# Patient Record
Sex: Female | Born: 1975 | Race: White | Hispanic: No | Marital: Married | Smoking: Never smoker
Health system: Southern US, Community
[De-identification: ages and names within clinical notes are randomized; demographics above are authoritative.]

## PROBLEM LIST (undated history)

## (undated) DIAGNOSIS — F419 Anxiety disorder, unspecified: Secondary | ICD-10-CM

## (undated) HISTORY — PX: BARIATRIC SURGERY: SHX1103

## (undated) HISTORY — DX: Anxiety disorder, unspecified: F41.9

---

## 2000-02-08 ENCOUNTER — Other Ambulatory Visit: Admission: RE | Admit: 2000-02-08 | Discharge: 2000-02-08 | Payer: Self-pay | Admitting: Obstetrics and Gynecology

## 2000-08-21 ENCOUNTER — Inpatient Hospital Stay (HOSPITAL_COMMUNITY): Admission: AD | Admit: 2000-08-21 | Discharge: 2000-08-21 | Payer: Self-pay | Admitting: Obstetrics and Gynecology

## 2000-09-03 ENCOUNTER — Inpatient Hospital Stay (HOSPITAL_COMMUNITY): Admission: AD | Admit: 2000-09-03 | Discharge: 2000-09-03 | Payer: Self-pay | Admitting: Obstetrics and Gynecology

## 2000-09-04 ENCOUNTER — Inpatient Hospital Stay (HOSPITAL_COMMUNITY): Admission: AD | Admit: 2000-09-04 | Discharge: 2000-09-06 | Payer: Self-pay | Admitting: Obstetrics and Gynecology

## 2000-12-10 ENCOUNTER — Emergency Department (HOSPITAL_COMMUNITY): Admission: EM | Admit: 2000-12-10 | Discharge: 2000-12-10 | Payer: Self-pay | Admitting: Emergency Medicine

## 2021-04-04 ENCOUNTER — Ambulatory Visit: Payer: Self-pay | Admitting: Family Medicine

## 2021-04-04 ENCOUNTER — Other Ambulatory Visit: Payer: Self-pay

## 2021-04-04 ENCOUNTER — Encounter: Payer: Self-pay | Admitting: Family Medicine

## 2021-04-04 VITALS — BP 117/84 | HR 72 | Ht 64.0 in | Wt 234.8 lb

## 2021-04-04 DIAGNOSIS — Z7689 Persons encountering health services in other specified circumstances: Secondary | ICD-10-CM

## 2021-04-04 DIAGNOSIS — F331 Major depressive disorder, recurrent, moderate: Secondary | ICD-10-CM

## 2021-04-04 DIAGNOSIS — F411 Generalized anxiety disorder: Secondary | ICD-10-CM

## 2021-04-04 DIAGNOSIS — F3341 Major depressive disorder, recurrent, in partial remission: Secondary | ICD-10-CM | POA: Insufficient documentation

## 2021-04-04 DIAGNOSIS — N6315 Unspecified lump in the right breast, overlapping quadrants: Secondary | ICD-10-CM

## 2021-04-04 DIAGNOSIS — Z803 Family history of malignant neoplasm of breast: Secondary | ICD-10-CM

## 2021-04-04 MED ORDER — ESCITALOPRAM OXALATE 10 MG PO TABS: 10.0000 mg | ORAL_TABLET | Freq: Every day | ORAL | 2 refills | Status: DC

## 2021-04-04 NOTE — Patient Instructions (Addendum)
Thank you for coming to the office today.  As discussed, it sounds like your symptoms are primarily related to anxiety / adjustment disorder. This is a very common problem and be related to several factors, including life stressors.  Start treatment with Lexapro, take 10mg  one daily for now. As discussed most anxiety medications are also used for mood disorders such as depression, because they work on similar chemicals in your brain. It may take up to 3-4 weeks for the medicine to take full effect and for you to notice a difference, sometimes you may notice it working sooner, otherwise we may need to adjust the dose.  For most patients with anxiety or mood concerns, we generally recommend referral to establish with a therapist or counselor as well. This has been shown to improve the effectiveness of the medications, and in the future we may be able to taper off medications.   Call me back with location you prefer for therapy / psychiatry and we can refer.  -------------------------------------  For Mammogram screening for breast cancer   Call the [place] below anytime to schedule your own appointment now that order has been placed.  [facility] [place], [place] [address] Phone: [phone]   ----------------------------------------------------  PSYCHIATRY ([place])   [place] Address: [address], [place], [place] [address] [email or web address] Phone: [phone]  [place] ([place] [facility]) Address: [address], [place], [address] [address] Hours: 8:30AM-5PM Phone: [phone]  [place] [facility] [place], [place] [address] Phone: [phone]  Cephus Shelling, MD [place], [place]  [address] Phone: [phone]  [facility] (All ages) [address], [place], [id number] Phone: [phone] (Option 1) [email or web address]   [facility]) [place] [address] [place], [place] [address] Phone: [phone]   ----------------------------------------------------------------- [place] [address] [place], [place] [address] Johnnette Litter P: [place] - online and also in [facility], [address] [place], [place] [address]: [place].   Address: [place], [place], [place] [address] Hours: Open today  9AM-7PM Phone: [phone]  [facility] [address], [place] Address: [address] [place], [place],  [address] Phone: [phone]     Please schedule a Follow-up Appointment to: Return in about 4 weeks (around 05/02/2021) for 4 weeks follow-up Depression/Anxiety med adjust.  If you have any other questions or concerns, please feel free to call the office or send a message through [place]. You may also schedule an earlier appointment if necessary.  Additionally, you may be receiving a survey about your experience at our office within a few days to 1 week by e-mail or mail. We value your feedback.  [name], DO [place]

## 2021-04-04 NOTE — Progress Notes (Signed)
Subjective:    Patient ID: Elizabeth Schultz, female    DOB: 1976-02-13, 45 y.o.   MRN: [id number]  Elizabeth Schultz is a 45 y.o. female presenting on 04/04/2021 for Establish Care Chief complaint - depression  Previous PCP Dr Luan Pulling [facility]) years ago, now returning to clinic >5  HPI  Mood Disorder / Anxiety mixed Describes more of a chronic problem, has not been addressed before. Never on treatment or therapy before. She has experienced major life stressor change, married for 22 years and husband left 1 month ago, and he left because she has not addressed some mental health issues prior. -  - She is worried about her family history mental health. Father dx Bipolar Disorder in age 8s, and also Grandmother on paternal side has similar issues in past. Her father is on medication. Admits Fatigue that has been more severe onset  Intentional Weight Loss She is doing the [place] program since Feb 2022, has lost 48 lbs  Right Breast Lump Admits a localized spot on R 3 o clock breast lump, has felt a spot on breast that is a sore tender spot or lump, unsure if she has had this before, now only noticed with weight loss. - Maternal side of family - Grandmother age 46 and and 65 age 32 with breast cancer - Paternal side of family - Aunt had breast cancer age 59   Depression screen PHQ 2/9 04/04/2021  Decreased Interest 2  Down, Depressed, Hopeless 3  PHQ - 2 Score 5  Altered sleeping 3  Tired, decreased energy 3  Change in appetite 0  Feeling bad or failure about yourself  3  Trouble concentrating 2  Moving slowly or fidgety/restless 0  Suicidal thoughts 1  PHQ-9 Score 17  Difficult doing work/chores Extremely dIfficult   GAD 7 : Generalized Anxiety Score 04/04/2021  Nervous, Anxious, on Edge 3  Control/stop worrying 3  Worry too much - different things 3  Trouble relaxing 3  Restless 2  Easily annoyed or irritable 3  Afraid - awful might happen 3  Total GAD 7 Score 20  Anxiety  Difficulty Very difficult      Past Medical History:  Diagnosis Date   Anxiety    Past Surgical History:  Procedure Laterality Date   ANTERIOR CRUCIATE LIGAMENT REPAIR  05/2011   TUBAL LIGATION Bilateral 03/2003   WISDOM TOOTH EXTRACTION Bilateral 11/1995   Social History   Socioeconomic History   Marital status: Married    Spouse name: Not on file   Number of children: Not on file   Years of education: Not on file   Highest education level: Not on file  Occupational History   Not on file  Tobacco Use   Smoking status: Never   Smokeless tobacco: Never  Substance and Sexual Activity   Alcohol use: Not Currently   Drug use: Never   Sexual activity: Yes  Other Topics Concern   Not on file  Social History Narrative   Not on file   Social Determinants of Health   Financial Resource Strain: Not on file  Food Insecurity: Not on file  Transportation Needs: Not on file  Physical Activity: Not on file  Stress: Not on file  Social Connections: Not on file  Intimate Partner Violence: Not on file   Family History  Problem Relation Age of Onset   Heart disease Mother    Thyroid disease Mother    Bipolar disorder Father    Breast cancer  Maternal Grandmother 45   Bipolar disorder Paternal Grandmother    Breast cancer Maternal Aunt 52   Breast cancer Paternal Aunt 29   Current Outpatient Medications on File Prior to Visit  Medication Sig   omeprazole (PRILOSEC) 20 MG capsule Take 20 mg by mouth daily.   ibuprofen (ADVIL) 600 MG tablet Take by mouth.   No current facility-administered medications on file prior to visit.    Review of Systems Per HPI unless specifically indicated above      Objective:    BP 117/84   Pulse 72   Ht 5\' 4"  (1.626 m)   Wt 234 lb 12.8 oz (106.5 kg)   SpO2 99%   BMI 40.30 kg/m   Wt Readings from Last 3 Encounters:  04/04/21 234 lb 12.8 oz (106.5 kg)    Physical Exam Vitals and nursing note reviewed.  Constitutional:       General: She is not in acute distress.    Appearance: Normal appearance. She is well-developed. She is not diaphoretic.     Comments: Well-appearing, comfortable, cooperative  HENT:     Head: Normocephalic and atraumatic.  Eyes:     General:        Right eye: No discharge.        Left eye: No discharge.     Conjunctiva/sclera: Conjunctivae normal.  Cardiovascular:     Rate and Rhythm: Normal rate.  Pulmonary:     Effort: Pulmonary effort is normal.  Chest:    Skin:    General: Skin is warm and dry.     Findings: No erythema or rash.  Neurological:     Mental Status: She is alert and oriented to person, place, and time.  Psychiatric:        Behavior: Behavior normal.        Thought Content: Thought content normal.     Comments: Well groomed, good eye contact, normal speech and thoughts. Depressed mood and anxious at times, tearful   No results found for this or any previous visit.    Assessment & Plan:   Problem List Items Addressed This Visit     Major depressive disorder, recurrent, moderate (HCC)   Relevant Medications   escitalopram (LEXAPRO) 10 MG tablet   GAD (generalized anxiety disorder)   Relevant Medications   escitalopram (LEXAPRO) 10 MG tablet   Other Visit Diagnoses     Breast lump on right side at 3 o'clock position    -  Primary   Relevant Orders   MM DIAG BREAST TOMO BILATERAL   US BREAST LTD UNI RIGHT INC AXILLA   Encounter to establish care with new doctor       Family history of breast cancer           Major Depression recurrent moderate Generalized Anxiety Disorder  Clinically with relatively new worsening flare of chronic underlying mental health problem that has not been formally diagnosed and treated before  Significant life stressors and factors at play with husband leaving her related to the mental health issue and she has some strong fam history of mental health problems that is worrying her more as well.  Currently based on scoring  of symptoms and history it seems she has primarily unipolar major depression, no evidence of bipolar based on history and discussion today, can pursue further screening tools at next visit. Anxiety is secondary it seems to mood. Has secondary physical symptoms from depression including significant fatigue now worse following onset of depression  Discussion today on treatment options. She has current limited support system.   Plan: 1. Discussion on new diagnosis management, complications 2. Start Escitalopram 10mg  daily AM with food, counseling on potential side effects risks, reviewed possible GI intolerance - anticipate 4-6 weeks for notable effect, may need titrate dose to 20 in future 3. Referral list given handout for therapy / psychiatry - strongly recommended finding therapist at least, she can contact us when ready for referral 4. Follow-up 4 weeks to adjust med and evaluate ---------  R Breast Lump/Mass No prior mammogram screening Strong fam history of breast cancer  Order Diagnostic Mammo + [place] at [place] This Encounter  Procedures   MM DIAG BREAST TOMO BILATERAL    Standing Status:   Future    Standing Expiration Date:   10/04/2021    Order Specific Question:   Reason for Exam (SYMPTOM  OR DIAGNOSIS REQUIRED)    Answer:   Right breast 3 o clock range central lump/mass, mild tender, strong fam history of breast cancer. no prior mammogram.    Order Specific Question:   Preferred imaging location?    Answer:   [facility]   US BREAST LTD UNI RIGHT INC AXILLA    Standing Status:   Future    Standing Expiration Date:   10/04/2021    Order Specific Question:   Reason for Exam (SYMPTOM  OR DIAGNOSIS REQUIRED)    Answer:   Right breast 3 o clock central area lump/mass tender, strong fam history of breast cancer, no prior mammogram    Order Specific Question:   Preferred imaging location?    Answer:   [facility] ordered this encounter   Medications   escitalopram (LEXAPRO) 10 MG tablet    Sig: Take 1 tablet (10 mg total) by mouth daily.    Dispense:  30 tablet    Refill:  2     Follow up plan: Return in about 4 weeks (around 05/02/2021) for 4 weeks follow-up Depression/Anxiety med adjust.   [name], DO [facility] 04/04/2021, 10:18 AM

## 2021-04-06 ENCOUNTER — Other Ambulatory Visit: Payer: Self-pay

## 2021-04-06 ENCOUNTER — Ambulatory Visit
Admission: RE | Admit: 2021-04-06 | Discharge: 2021-04-06 | Disposition: A | Discharge status: Home / Self Care | Payer: BC Managed Care – PPO | Source: Ambulatory Visit | Attending: Family Medicine | Admitting: Family Medicine

## 2021-04-06 DIAGNOSIS — N6315 Unspecified lump in the right breast, overlapping quadrants: Secondary | ICD-10-CM

## 2021-05-02 ENCOUNTER — Other Ambulatory Visit: Payer: Self-pay

## 2021-05-02 ENCOUNTER — Encounter: Payer: Self-pay | Admitting: Family Medicine

## 2021-05-02 ENCOUNTER — Ambulatory Visit (INDEPENDENT_AMBULATORY_CARE_PROVIDER_SITE_OTHER): Payer: BC Managed Care – PPO | Admitting: Family Medicine

## 2021-05-02 VITALS — BP 112/62 | HR 65 | Ht 64.0 in | Wt 230.4 lb

## 2021-05-02 DIAGNOSIS — F411 Generalized anxiety disorder: Secondary | ICD-10-CM

## 2021-05-02 DIAGNOSIS — F331 Major depressive disorder, recurrent, moderate: Secondary | ICD-10-CM

## 2021-05-02 MED ORDER — BUSPIRONE HCL 5 MG PO TABS: 5.0000 mg | ORAL_TABLET | Freq: Three times a day (TID) | ORAL | 2 refills | Status: DC

## 2021-05-02 NOTE — Progress Notes (Signed)
Subjective:    Patient ID: Elizabeth Schultz, female    DOB: 1976/06/16, 45 y.o.   MRN: [id number]  Elizabeth Schultz is a 45 y.o. female presenting on 05/02/2021 for Depression and Anxiety   HPI  Follow-up R Breast Lump / Mammogram She had work up last time 03/2021 with diagnostic mammo / [place], ultimately determined that she has dense breast tissue and negative test results, repeat in 1 year. She may be eligible for MRI in future. With dense tissue.  Generalized Anxiety Disorder (GAD) Major Depression recurrent moderate  Last visit 04/04/21, she was initiated on Escitalopram 10mg  daily, see prior note. No prior treatment. She has noticed improvement overall in her mood symptoms but only partially improved. Still has significant stressors and anxiety impacting her daily function. She describes stressors as before with work and home situation. As mentioned last last visit.  She has therapist / counselor through work, talks to them 2-3 times a week, does help  She feels like the anxiety did not seem to be controlled as well on med.  Father dx Bipolar Disorder in age 2s, and also Grandmother on paternal side has similar issues in past. Her father is on medication.    Depression screen [facility] 2/9 05/02/2021 04/04/2021  Decreased Interest 2 2  Down, Depressed, Hopeless 2 3  PHQ - 2 Score 4 5  Altered sleeping 2 3  Tired, decreased energy 2 3  Change in appetite 0 0  Feeling bad or failure about yourself  2 3  Trouble concentrating 2 2  Moving slowly or fidgety/restless 0 0  Suicidal thoughts 1 1  PHQ-9 Score 13 17  Difficult doing work/chores Very difficult Extremely dIfficult   GAD 7 : Generalized Anxiety Score 05/02/2021 04/04/2021  Nervous, Anxious, on Edge 2 3  Control/stop worrying 3 3  Worry too much - different things 3 3  Trouble relaxing 3 3  Restless 0 2  Easily annoyed or irritable 2 3  Afraid - awful might happen 3 3  Total GAD 7 Score 16 20  Anxiety Difficulty Very  difficult Very difficult     Social History   Tobacco Use   Smoking status: Never   Smokeless tobacco: Never  Substance Use Topics   Alcohol use: Not Currently   Drug use: Never    Review of Systems Per HPI unless specifically indicated above     Objective:    BP 112/62   Pulse 65   Ht 5\' 4"  (1.626 m)   Wt 230 lb 6.4 oz (104.5 kg)   SpO2 100%   BMI 39.55 kg/m   Wt Readings from Last 3 Encounters:  05/02/21 230 lb 6.4 oz (104.5 kg)  04/04/21 234 lb 12.8 oz (106.5 kg)    Physical Exam Vitals and nursing note reviewed.  Constitutional:      General: She is not in acute distress.    Appearance: Normal appearance. She is well-developed. She is not diaphoretic.     Comments: Well-appearing, comfortable, cooperative  HENT:     Head: Normocephalic and atraumatic.  Eyes:     General:        Right eye: No discharge.        Left eye: No discharge.     Conjunctiva/sclera: Conjunctivae normal.  Cardiovascular:     Rate and Rhythm: Normal rate.  Pulmonary:     Effort: Pulmonary effort is normal.  Skin:    General: Skin is warm and dry.  Findings: No erythema or rash.  Neurological:     Mental Status: She is alert and oriented to person, place, and time.  Psychiatric:        Mood and Affect: Mood normal.        Behavior: Behavior normal.        Thought Content: Thought content normal.     Comments: Well groomed, good eye contact, normal speech and thoughts   No results found for this or any previous visit.    Assessment & Plan:   Problem List Items Addressed This Visit     Major depressive disorder, recurrent, moderate (HCC) - Primary   Relevant Medications   busPIRone (BUSPAR) 5 MG tablet   GAD (generalized anxiety disorder)   Relevant Medications   busPIRone (BUSPAR) 5 MG tablet    Mood improved on SSRI Escitalopram 10mg  daily Anxiety limited benefit  Counseling with still has notable daily anxiety, generalized impacting function  Continue with CBT  therapist through work 2-3 days a week  Continue SSRI Escitalopram 10mg  daily, offer dose increase instead will add 2nd agent  Will add Buspar 5mg  2-3 times a day for anxiety maintenance. Can start twice a day first, then inc to 3 times a day if wears off during day. Adjust dose accordingly in future.  Return 4-6 weeks if not improved Return 3 months if doing better   Meds ordered this encounter  Medications   busPIRone (BUSPAR) 5 MG tablet    Sig: Take 1 tablet (5 mg total) by mouth 3 (three) times daily.    Dispense:  90 tablet    Refill:  2       Follow up plan: Return in about 3 months (around 08/02/2021) for 3 month for anxiety/mood follow-up, med adjust, or sooner 4-6 week if not improved.  Elizabeth Schultz, [facility] 05/02/2021, 12:15 PM

## 2021-05-02 NOTE — Patient Instructions (Addendum)
Start Buspar 5mg  2-3 times a day  Please schedule a Follow-up Appointment to: Return in about 3 months (around 08/02/2021) for 3 month for anxiety/mood follow-up, med adjust, or sooner 4-6 week if not improved.  If you have any other questions or concerns, please feel free to call the office or send a message through [place]. You may also schedule an earlier appointment if necessary.  Additionally, you may be receiving a survey about your experience at our office within a few days to 1 week by e-mail or mail. We value your feedback.  [name], DO [place]

## 2021-07-02 ENCOUNTER — Other Ambulatory Visit: Payer: Self-pay | Admitting: Family Medicine

## 2021-07-02 DIAGNOSIS — F411 Generalized anxiety disorder: Secondary | ICD-10-CM

## 2021-07-02 DIAGNOSIS — F331 Major depressive disorder, recurrent, moderate: Secondary | ICD-10-CM

## 2021-07-03 NOTE — Telephone Encounter (Signed)
Requested Prescriptions  Pending Prescriptions Disp Refills  . escitalopram (LEXAPRO) 10 MG tablet [Pharmacy Med Name: ESCITALOPRAM 10 MG TABLET] 30 tablet 2    Sig: TAKE 1 TABLET BY MOUTH EVERY DAY     Psychiatry:  Antidepressants - SSRI Passed - 07/02/2021  4:29 PM      Passed - Completed PHQ-2 or PHQ-9 in the last 360 days      Passed - Valid encounter within last 6 months    Recent Outpatient Visits          2 months ago Major depressive disorder, recurrent, moderate ([place])   [place], DO   3 months ago Breast lump on right side at 3 o'clock position   [place], DO      Future Appointments            In 1 month [place], Devonne Doughty, [facility], [facility]

## 2021-08-08 ENCOUNTER — Encounter: Payer: Self-pay | Admitting: Family Medicine

## 2021-08-08 ENCOUNTER — Ambulatory Visit: Payer: BC Managed Care – PPO | Admitting: Family Medicine

## 2021-08-08 ENCOUNTER — Other Ambulatory Visit: Payer: Self-pay

## 2021-08-08 VITALS — BP 128/76 | HR 67 | Ht 64.0 in | Wt 231.8 lb

## 2021-08-08 DIAGNOSIS — F3341 Major depressive disorder, recurrent, in partial remission: Secondary | ICD-10-CM

## 2021-08-08 DIAGNOSIS — F411 Generalized anxiety disorder: Secondary | ICD-10-CM

## 2021-08-08 MED ORDER — SAXENDA 18 MG/3ML ~~LOC~~ SOPN: PEN_INJECTOR | SUBCUTANEOUS | 2 refills | Status: DC

## 2021-08-08 MED ORDER — INSULIN PEN NEEDLE 31G X 8 MM MISC: 3 refills | Status: DC

## 2021-08-08 MED ORDER — BUSPIRONE HCL 5 MG PO TABS: 5.0000 mg | ORAL_TABLET | Freq: Two times a day (BID) | ORAL | 3 refills | Status: DC

## 2021-08-08 MED ORDER — ESCITALOPRAM OXALATE 10 MG PO TABS: 10.0000 mg | ORAL_TABLET | Freq: Every day | ORAL | 3 refills | Status: DC

## 2021-08-08 NOTE — Progress Notes (Signed)
Subjective:    Patient ID: Elizabeth Schultz, female    DOB: [birth year], 45 y.o.   MRN: [id number]  Elizabeth Schultz is a 45 y.o. female presenting on 08/08/2021 for Depression   HPI   Generalized Anxiety Disorder (GAD) Major Depression recurrent in partial remission  Last visit 04/2021, improved on Escitalopram 10mg , and added Buspar for anxiety  Today she does very well with improved mood and anxiety. Her current dosage is Escitalopram 10mg  daily and Buspar 5mg  twice a day    She has therapist / counselor through work, talks to them 2-3 times a week, does help    Morbid Obesity BMI >39 Weight 308 lbs She has lost 78 lbs She describes difficulty with losing excess tissue/skin Asks about plastic surgery for removal of excess tissue/skin Considering weight management medication now as well    Depression screen [facility] 2/9 08/08/2021 05/02/2021 04/04/2021  Decreased Interest 0 2 2  Down, Depressed, Hopeless 0 2 3  PHQ - 2 Score 0 4 5  Altered sleeping 0 2 3  Tired, decreased energy 0 2 3  Change in appetite 0 0 0  Feeling bad or failure about yourself  1 2 3   Trouble concentrating 0 2 2  Moving slowly or fidgety/restless 0 0 0  Suicidal thoughts 0 1 1  PHQ-9 Score 1 13 17   Difficult doing work/chores Somewhat difficult Very difficult Extremely dIfficult   GAD 7 : Generalized Anxiety Score 08/08/2021 05/02/2021 04/04/2021  Nervous, Anxious, on Edge 0 2 3  Control/stop worrying 0 3 3  Worry too much - different things 0 3 3  Trouble relaxing 0 3 3  Restless 1 0 2  Easily annoyed or irritable 0 2 3  Afraid - awful might happen 0 3 3  Total GAD 7 Score 1 16 20   Anxiety Difficulty Somewhat difficult Very difficult Very difficult      Social History   Tobacco Use   Smoking status: Never   Smokeless tobacco: Never  Substance Use Topics   Alcohol use: Not Currently   Drug use: Never    Family History  Problem Relation Age of Onset   Heart disease Mother    Thyroid  disease Mother    Bipolar disorder Father    Breast cancer Maternal Grandmother 57   Bipolar disorder Paternal Grandmother    Breast cancer Maternal Aunt 76   Breast cancer Paternal Aunt 47     Review of Systems Per HPI unless specifically indicated above     Objective:    BP 128/76   Pulse 67   Ht 5\' 4"  (1.626 m)   Wt 231 lb 12.8 oz (105.1 kg)   SpO2 100%   BMI 39.79 kg/m   Wt Readings from Last 3 Encounters:  08/08/21 231 lb 12.8 oz (105.1 kg)  05/02/21 230 lb 6.4 oz (104.5 kg)  04/04/21 234 lb 12.8 oz (106.5 kg)    Physical Exam Vitals and nursing note reviewed.  Constitutional:      General: She is not in acute distress.    Appearance: Normal appearance. She is well-developed. She is obese. She is not diaphoretic.     Comments: Well-appearing, comfortable, cooperative  HENT:     Head: Normocephalic and atraumatic.  Eyes:     General:        Right eye: No discharge.        Left eye: No discharge.     Conjunctiva/sclera: Conjunctivae normal.  Cardiovascular:  Rate and Rhythm: Normal rate.  Pulmonary:     Effort: Pulmonary effort is normal.  Skin:    General: Skin is warm and dry.     Findings: No erythema or rash.  Neurological:     Mental Status: She is alert and oriented to person, place, and time.  Psychiatric:        Mood and Affect: Mood normal.        Behavior: Behavior normal.        Thought Content: Thought content normal.     Comments: Well groomed, good eye contact, normal speech and thoughts    No results found for this or any previous visit.    Assessment & Plan:   Problem List Items Addressed This Visit     Morbid obesity ([place])   Relevant Medications   SAXENDA 18 MG/3ML SOPN   Insulin Pen Needle 31G X 8 MM MISC   Other Relevant Orders   Hemoglobin W6F   BASIC METABOLIC PANEL WITH GFR   TSH   Major depressive disorder, recurrent, in partial remission (HCC) - Primary   Relevant Medications   escitalopram (LEXAPRO) 10 MG tablet    busPIRone (BUSPAR) 5 MG tablet   GAD (generalized anxiety disorder)   Relevant Medications   escitalopram (LEXAPRO) 10 MG tablet   busPIRone (BUSPAR) 5 MG tablet    Mood improved, depression in partial remission now on SSRI Escitalopram 10mg  daily  Anxiety controlled on buspar 5mg  BID, rare PRN dose not using yet   Continue with CBT therapist through work   Morbid obesity BMI >39 Co morbid mood depression Successful lifestyle modification wt loss down 75 lbs, has some extra tissue/skin asking about plastic surgery in future, but would like to continue losing wt Check A1c, TSH BMET today Start Saxenda sample today, titrate dose, reviewed benefit risk side effects Ordered for PA Will follow-up within 3 months on progress wt loss   Meds ordered this encounter  Medications   escitalopram (LEXAPRO) 10 MG tablet    Sig: Take 1 tablet (10 mg total) by mouth daily.    Dispense:  90 tablet    Refill:  3   busPIRone (BUSPAR) 5 MG tablet    Sig: Take 1 tablet (5 mg total) by mouth 2 (two) times daily.    Dispense:  180 tablet    Refill:  3   SAXENDA 18 MG/3ML SOPN    Sig: Injection 0.6 mg into skin once daily for 1 week, as tolerated increase by increment of 0.6mg  every 1 week, max dose is 3mg  injection daily after 5 weeks.    Dispense:  15 mL    Refill:  2    Prior authorization to be completed   Insulin Pen Needle 31G X 8 MM MISC    Sig: Use with saxenda injection once daily    Dispense:  90 each    Refill:  3     Follow up plan: Return in about 3 months (around 11/08/2021) for 3 month follow-up Weight Management, Mood/Anxiety GAD PHQ.  [name], [facility] 08/08/2021, 8:59 AM

## 2021-08-08 NOTE — Patient Instructions (Addendum)
Thank you for coming to the office today.  Please check into Plastic Surgery options towards [facility] for future.  Saxenda (Liraglutide) - once DAILY - 3 dose changes 0.6, 1.2 and 1.8, side effects nausea, upset stomach higher on this one but it is still very effective medicine  We will work on prior authorization approval.  Use the copay coupon card included when ready to pick up.  Sample is for 3 weeks. Dose increase each week as tolerated, can stop inc or reduce if need   Please schedule a Follow-up Appointment to: Return in about 3 months (around 11/08/2021) for 3 month follow-up Weight Management, Mood/Anxiety GAD PHQ.  If you have any other questions or concerns, please feel free to call the office or send a message through [place]. You may also schedule an earlier appointment if necessary.  Additionally, you may be receiving a survey about your experience at our office within a few days to 1 week by e-mail or mail. We value your feedback.  [name], DO [place]

## 2021-08-09 LAB — BASIC METABOLIC PANEL WITH GFR
BUN: 12 mg/dL (ref 7–25)
CO2: 26 mmol/L (ref 20–32)
Calcium: 9 mg/dL (ref 8.6–10.2)
Chloride: 104 mmol/L (ref 98–110)
Creat: 0.74 mg/dL (ref 0.50–0.99)
Glucose, Bld: 94 mg/dL (ref 65–99)
Potassium: 4.4 mmol/L (ref 3.5–5.3)
Sodium: 139 mmol/L (ref 135–146)
eGFR: 103 mL/min/{1.73_m2} (ref >=60)

## 2021-08-09 LAB — HEMOGLOBIN A1C
Hgb A1c MFr Bld: 5 % of total Hgb (ref <5.7)
Mean Plasma Glucose: 97 mg/dL
eAG (mmol/L): 5.4 mmol/L

## 2021-08-09 LAB — TSH: TSH: 2.24 mIU/L

## 2021-08-13 ENCOUNTER — Other Ambulatory Visit: Payer: Self-pay | Admitting: Family Medicine

## 2021-08-13 DIAGNOSIS — F411 Generalized anxiety disorder: Secondary | ICD-10-CM

## 2021-08-13 NOTE — Telephone Encounter (Signed)
Called [facility] regarding prescription. [place] (Pharmacist) stated to disregard prescription request.

## 2021-10-11 ENCOUNTER — Ambulatory Visit: Payer: BC Managed Care – PPO | Admitting: Family Medicine

## 2021-11-11 ENCOUNTER — Ambulatory Visit: Payer: BC Managed Care – PPO | Admitting: Family Medicine

## 2021-11-11 ENCOUNTER — Encounter: Payer: Self-pay | Admitting: Family Medicine

## 2021-11-11 ENCOUNTER — Other Ambulatory Visit: Payer: Self-pay

## 2021-11-11 DIAGNOSIS — G5711 Meralgia paresthetica, right lower limb: Secondary | ICD-10-CM

## 2021-11-11 DIAGNOSIS — F3341 Major depressive disorder, recurrent, in partial remission: Secondary | ICD-10-CM | POA: Diagnosis not present

## 2021-11-11 DIAGNOSIS — F411 Generalized anxiety disorder: Secondary | ICD-10-CM | POA: Diagnosis not present

## 2021-11-11 MED ORDER — SAXENDA 18 MG/3ML ~~LOC~~ SOPN: 3.0000 mg | PEN_INJECTOR | Freq: Every day | SUBCUTANEOUS | 5 refills | Status: DC

## 2021-11-11 NOTE — Progress Notes (Signed)
Subjective:    Patient ID: Elizabeth Schultz, female    DOB: [birth year], 46 y.o.   MRN: [id number]  Elizabeth Schultz is a 46 y.o. 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 Her current dosage is Escitalopram 191mdaily and Buspar 56m58mwice a day She has therapist / counselor through work, talks to them 2-3 times a week, does help   Morbid Obesity BMI >38 Weight down to 224, down 10 lbs in past 6 months She describes difficulty with losing excess tissue/skin Improved overall with reduced PO intake on med, doing well wants to continue Saxenda 3mg81msing daily, has savings card. Asks about plastic surgery for removal of excess tissue/skin Considering weight management medication now as well  GI updates Food Impaction / Eosinophilic Esophagitis /GERD 10/13/21 ED visit at [facility] Had food impaction choking, she is on liquid diet and not able to eat meat right now. She has apt today follow up with [facility] and has scheduled EGD 12/30/21. - She was placed on high dose Omeprazole with major improvement.  Right Low Back Pain Meralgia Paresthetica Reports R lateral thigh region pins needles tingling. Worse with fixed position or laying on it at night.   Depression screen PHQ [facility] 11/11/2021 08/08/2021 05/02/2021  Decreased Interest 0 0 2  Down, Depressed, Hopeless 0 0 2  PHQ - 2 Score 0 0 4  Altered sleeping 0 0 2  Tired, decreased energy 0 0 2  Change in appetite 0 0 0  Feeling bad or failure about yourself  1 1 2   Trouble concentrating 0 0 2  Moving slowly or fidgety/restless 0 0 0  Suicidal thoughts 0 0 1  PHQ-9 Score 1 1 13   Difficult doing work/chores Not difficult at all Somewhat difficult Very difficult   GAD 7 : Generalized Anxiety Score  11/11/2021 08/08/2021 05/02/2021 04/04/2021  Nervous, Anxious, on Edge 0 0 2 3  Control/stop worrying 0 0 3 3  Worry too much - different things 0 0 3 3  Trouble relaxing 0 0 3 3  Restless 1 1 0 2  Easily annoyed or irritable 0 0 2 3  Afraid - awful might happen 0 0 3 3  Total GAD 7 Score 1 1 16 20   Anxiety Difficulty Somewhat difficult Somewhat difficult Very difficult Very difficult      Social History   Tobacco Use   Smoking status: Never   Smokeless tobacco: Never  Substance Use Topics   Alcohol use: Not Currently   Drug use: Never    Review of Systems Per HPI unless specifically indicated above     Objective:    BP (!) 137/91    Pulse 73    Ht 5' 4"  (1.626 m)    Wt 224 lb 6.4 oz (101.8 kg)    SpO2 100%    BMI 38.52 kg/m   Wt Readings from Last 3 Encounters:  11/11/21 224 lb 6.4 oz (101.8 kg)  08/08/21 231 lb 12.8 oz (105.1 kg)  05/02/21 230 lb 6.4 oz (104.5 kg)    Physical Exam Vitals and nursing note reviewed.  Constitutional:      General: She is not in acute distress.    Appearance: Normal appearance. She is well-developed. She is not diaphoretic.  Comments: Well-appearing, comfortable, cooperative  HENT:     Head: Normocephalic and atraumatic.  Eyes:     General:        Right eye: No discharge.        Left eye: No discharge.     Conjunctiva/sclera: Conjunctivae normal.  Cardiovascular:     Rate and Rhythm: Normal rate.  Pulmonary:     Effort: Pulmonary effort is normal.  Skin:    General: Skin is warm and dry.     Findings: No erythema or rash.  Neurological:     Mental Status: She is alert and oriented to person, place, and time.     Sensory: Sensory deficit (R lateral thigh) present.  Psychiatric:        Mood and Affect: Mood normal.        Behavior: Behavior normal.        Thought Content: Thought content normal.     Comments: Well groomed, good eye contact, normal speech and thoughts     Results for orders placed or performed in visit  on 08/08/21  Hemoglobin A1c  Result Value Ref Range   Hgb A1c MFr Bld 5.0 <5.7 % of total Hgb   Mean Plasma Glucose 97 mg/dL   eAG (mmol/L) 5.4 mmol/L  BASIC METABOLIC PANEL WITH GFR  Result Value Ref Range   Glucose, Bld 94 65 - 99 mg/dL   BUN 12 7 - 25 mg/dL   Creat 0.74 0.50 - 0.99 mg/dL   eGFR 103 > OR = 60 mL/min/1.65m   BUN/Creatinine Ratio NOT APPLICABLE 6 - 22 (calc)   Sodium 139 135 - 146 mmol/L   Potassium 4.4 3.5 - 5.3 mmol/L   Chloride 104 98 - 110 mmol/L   CO2 26 20 - 32 mmol/L   Calcium 9.0 8.6 - 10.2 mg/dL  TSH  Result Value Ref Range   TSH 2.24 mIU/L      Assessment & Plan:   Problem List Items Addressed This Visit     Morbid obesity ([place] - Primary   Relevant Medications   SAXENDA 18 MG/3ML SOPN   Major depressive disorder, recurrent, in partial remission (HCC)   GAD (generalized anxiety disorder)   Other Visit Diagnoses     Meralgia paresthetica of right side       Relevant Medications   SAXENDA 18 MG/3ML SOPN       Mood improved, depression in partial remission now Anxiety controlled on buspar 562mBID regular dosing. on SSRI Escitalopram 1048maily  Continue with CBT therapist through work   Morbid obesity BMI >38 Co morbid mood depression Successful lifestyle modification wt loss down 75 lbs since onset. Down 10 lbs in 6 months Re order Saxenda 3mg47mily inj 3-6 more months TBD  Meralgia Paresthetica R thigh Assoc R LBP and symptoms localized to R lateral thigh very positional and certain triggers Reviewed treatment OTC NSAID course, back treatments and future consider Gabapentin for neuropathic symptoms, main goal is to avoid behavior that is pinching nerve and modify activity See AVS  Meds ordered this encounter  Medications   SAXENDA 18 MG/3ML SOPN    Sig: Inject 3 mg into the skin daily.    Dispense:  15 mL    Refill:  5      Follow up plan: Return in about 6 months (around 05/11/2022) for 6 month Annual Physical AM fasting  lab AFTER.  Future Thyroid testing add to panel.  AlexNobie Putnam [facility] 11/11/2021, 8:49 AM

## 2021-11-11 NOTE — Patient Instructions (Addendum)
Thank you for coming to the office today.  Refilled Saxenda for 3mg  dosing, keep on track. Great job so far.  Online search for [employer] Card if you cannot reactive the previous one  First for the Meralgia Paresthetica - try iburpofen or aleve, prefer aleve OTC 220 or 250mg  x 2 = 500mg  approx twice a day with meal for 1-2 weeks.   Avoid fixed position prolonged time that strains it.  Heating pad, muscle rub, stretching are good.  IF NEEDED can order a temporary course of nerve pain medication Start Gabapentin 100mg  capsules, take at night for 2-3 nights only, and then increase to 2 times a day for a few days, and then may increase to 3 times a day, it may make you drowsy, if helps significantly at night only, then you can increase instead to 3 capsules at night, instead of 3 times a day - In the future if needed, we can significantly increase the dose if tolerated well, some common doses are 300mg  three times a day up to 600mg  three times a day, usually it takes several weeks or months to get to higher doses   Please schedule a Follow-up Appointment to: Return in about 6 months (around 05/11/2022) for 6 month Annual Physical AM fasting lab AFTER.  If you have any other questions or concerns, please feel free to call the office or send a message through [place]. You may also schedule an earlier appointment if necessary.  Additionally, you may be receiving a survey about your experience at our office within a few days to 1 week by e-mail or mail. We value your feedback.  [name], DO [place]

## 2021-12-23 ENCOUNTER — Encounter: Payer: Self-pay | Admitting: Family Medicine

## 2022-03-17 HISTORY — PX: LAPAROSCOPIC CHOLECYSTECTOMY: SUR755

## 2022-05-11 ENCOUNTER — Encounter: Payer: Self-pay | Admitting: Family Medicine

## 2022-05-11 ENCOUNTER — Ambulatory Visit (INDEPENDENT_AMBULATORY_CARE_PROVIDER_SITE_OTHER): Payer: BC Managed Care – PPO | Admitting: Family Medicine

## 2022-05-11 VITALS — BP 136/82 | HR 73 | Ht 64.0 in | Wt 229.8 lb

## 2022-05-11 DIAGNOSIS — F411 Generalized anxiety disorder: Secondary | ICD-10-CM

## 2022-05-11 DIAGNOSIS — N921 Excessive and frequent menstruation with irregular cycle: Secondary | ICD-10-CM

## 2022-05-11 DIAGNOSIS — Z9049 Acquired absence of other specified parts of digestive tract: Secondary | ICD-10-CM

## 2022-05-11 DIAGNOSIS — K55069 Acute infarction of intestine, part and extent unspecified: Secondary | ICD-10-CM

## 2022-05-11 DIAGNOSIS — F3341 Major depressive disorder, recurrent, in partial remission: Secondary | ICD-10-CM

## 2022-05-11 DIAGNOSIS — D259 Leiomyoma of uterus, unspecified: Secondary | ICD-10-CM

## 2022-05-11 DIAGNOSIS — K6389 Other specified diseases of intestine: Secondary | ICD-10-CM

## 2022-05-11 LAB — CBC WITH DIFFERENTIAL/PLATELET
Absolute Monocytes: 547 cells/uL (ref 200–950)
Basophils Absolute: 50 cells/uL (ref 0–200)
Basophils Relative: 0.7 %
Eosinophils Absolute: 180 cells/uL (ref 15–500)
Eosinophils Relative: 2.5 %
HCT: 38.6 % (ref 35.0–45.0)
Hemoglobin: 12.1 g/dL (ref 11.7–15.5)
Lymphs Abs: 1980 cells/uL (ref 850–3900)
MCH: 26.8 pg — ABNORMAL LOW (ref 27.0–33.0)
MCHC: 31.3 g/dL — ABNORMAL LOW (ref 32.0–36.0)
MCV: 85.6 fL (ref 80.0–100.0)
MPV: 9.8 fL (ref 7.5–12.5)
Monocytes Relative: 7.6 %
Neutro Abs: 4442 cells/uL (ref 1500–7800)
Neutrophils Relative %: 61.7 %
Platelets: 339 10*3/uL (ref 140–400)
RBC: 4.51 10*6/uL (ref 3.80–5.10)
RDW: 17 % — ABNORMAL HIGH (ref 11.0–15.0)
Total Lymphocyte: 27.5 %
WBC: 7.2 10*3/uL (ref 3.8–10.8)

## 2022-05-11 MED ORDER — BUSPIRONE HCL 5 MG PO TABS: 5.0000 mg | ORAL_TABLET | Freq: Three times a day (TID) | ORAL | 3 refills | Status: DC

## 2022-05-11 NOTE — Progress Notes (Signed)
Subjective:    Patient ID: Elizabeth Schultz, female    DOB: 25-Jun-1976, 46 y.o.   MRN: [id number]  Elizabeth Schultz is a 46 y.o. female presenting on 05/11/2022 for elevated BP, hospital f/u, mesenteric mass resection   HPI  Mesenteric mass s/p excision Cholelithiasis s/p cholecystectomy  Small bowel resection History of EOE, has had upper endoscopy  Seen by [facility] GI, she had abdominal pain, and they were concerned and sent her to [facility] ED, she was found on scans to find a mesenteric soft tissue mass pressing down on mesenteric vessels and caused a blood clot, she had gallstones and she pursued surgery for Cholecystectomy and soft tissue mass was removed and it was unknown origin and determined to not be cancerous. Pathology was benign (benign hyalinized spindle cell proliferation w/ lymph inflammation), lymph node resection, partial small bowel resection. All negative for malignancy  No more oncology follow-up but they would like to see a scan surveillance follow-up 3-6 months and requested imaging.  On Eliquis due to the blood clot. She will return after 3 months of therapy anticoagulation to [facility] 06/20/22 for determining if need to continue eliquis.   Elevated BP without HTN  Previously BP has been normal, and also during prior surgery she had very low surgery.  She was seen 05/05/22 by OBGYN. They checked 3 times at the office and it was elevated 166/95. She describes anxiety, they did prior scan that found 4 uterine fibroids. She is a good candidate for hysterectomy but reconsider in future. She would consider Uterine Fibroid Embolization (UFE) by IR, she has severe menstrual cycles and heavy blood flow.  Yesterday 135/85 at work, when she checks it.  Stopped Saxenda since concern possible early pancreatitis   Anxiety She is improved on current meds but feels Buspar is not lasting during the day, she takes 13m BID currently.     05/11/2022    9:04 AM  11/11/2021    8:36 AM 08/08/2021    8:52 AM  Depression screen PHQ 2/9  Decreased Interest 0 0 0  Down, Depressed, Hopeless 1 0 0  PHQ - 2 Score 1 0 0  Altered sleeping 2 0 0  Tired, decreased energy 2 0 0  Change in appetite 1 0 0  Feeling bad or failure about yourself  2 1 1   Trouble concentrating 0 0 0  Moving slowly or fidgety/restless 0 0 0  Suicidal thoughts 0 0 0  PHQ-9 Score 8 1 1   Difficult doing work/chores Not difficult at all Not difficult at all Somewhat difficult      05/11/2022    9:04 AM 11/11/2021    8:37 AM 08/08/2021    8:53 AM 05/02/2021    9:00 AM  GAD 7 : Generalized Anxiety Score  Nervous, Anxious, on Edge 1 0 0 2  Control/stop worrying 1 0 0 3  Worry too much - different things 2 0 0 3  Trouble relaxing 2 0 0 3  Restless 1 1 1  0  Easily annoyed or irritable 0 0 0 2  Afraid - awful might happen 2 0 0 3  Total GAD 7 Score 9 1 1 16   Anxiety Difficulty Not difficult at all Somewhat difficult Somewhat difficult Very difficult      Past Medical History:  Diagnosis Date   Anxiety    Past Surgical History:  Procedure Laterality Date   ANTERIOR CRUCIATE LIGAMENT REPAIR  05/2011   TUBAL LIGATION Bilateral 03/2003  WISDOM TOOTH EXTRACTION Bilateral 11/1995   Social History   Socioeconomic History   Marital status: Married    Spouse name: Not on file   Number of children: Not on file   Years of education: Not on file   Highest education level: Not on file  Occupational History   Not on file  Tobacco Use   Smoking status: Never   Smokeless tobacco: Never  Substance and Sexual Activity   Alcohol use: Not Currently   Drug use: Never   Sexual activity: Yes  Other Topics Concern   Not on file  Social History Narrative   Not on file   Social Determinants of Health   Financial Resource Strain: Not on file  Food Insecurity: Not on file  Transportation Needs: Not on file  Physical Activity: Not on file  Stress: Not on file  Social  Connections: Not on file  Intimate Partner Violence: Not on file   Family History  Problem Relation Age of Onset   Heart disease Mother    Thyroid disease Mother    Bipolar disorder Father    Breast cancer Maternal Grandmother 45   Bipolar disorder Paternal Grandmother    Breast cancer Maternal Aunt 75   Breast cancer Paternal Aunt 60   Current Outpatient Medications on File Prior to Visit  Medication Sig   ELIQUIS 5 MG TABS tablet Take 5 mg by mouth 2 (two) times daily.   escitalopram (LEXAPRO) 10 MG tablet Take 1 tablet (10 mg total) by mouth daily.   ibuprofen (ADVIL) 600 MG tablet Take by mouth.   Insulin Pen Needle 31G X 8 MM MISC Use with saxenda injection once daily   omeprazole (PRILOSEC) 20 MG capsule Take 20 mg by mouth daily.   No current facility-administered medications on file prior to visit.    Review of Systems Per HPI unless specifically indicated above      Objective:    BP 136/82 (BP Location: Left Arm, Patient Position: Sitting, Cuff Size: Normal)   Pulse 73   Ht 5' 4"  (1.626 m)   Wt 229 lb 12.8 oz (104.2 kg)   SpO2 100%   BMI 39.45 kg/m   Wt Readings from Last 3 Encounters:  05/11/22 229 lb 12.8 oz (104.2 kg)  11/11/21 224 lb 6.4 oz (101.8 kg)  08/08/21 231 lb 12.8 oz (105.1 kg)    Physical Exam Vitals and nursing note reviewed.  Constitutional:      General: She is not in acute distress.    Appearance: Normal appearance. She is well-developed. She is not diaphoretic.     Comments: Well-appearing, comfortable, cooperative  HENT:     Head: Normocephalic and atraumatic.  Eyes:     General:        Right eye: No discharge.        Left eye: No discharge.     Conjunctiva/sclera: Conjunctivae normal.  Cardiovascular:     Rate and Rhythm: Normal rate.  Pulmonary:     Effort: Pulmonary effort is normal.  Skin:    General: Skin is warm and dry.     Findings: No erythema or rash.  Neurological:     Mental Status: She is alert and oriented to  person, place, and time.  Psychiatric:        Mood and Affect: Mood normal.        Behavior: Behavior normal.        Thought Content: Thought content normal.     Comments: Well  groomed, good eye contact, normal speech and thoughts       Results for orders placed or performed in visit on 08/08/21  Hemoglobin A1c  Result Value Ref Range   Hgb A1c MFr Bld 5.0 <5.7 % of total Hgb   Mean Plasma Glucose 97 mg/dL   eAG (mmol/L) 5.4 mmol/L  BASIC METABOLIC PANEL WITH GFR  Result Value Ref Range   Glucose, Bld 94 65 - 99 mg/dL   BUN 12 7 - 25 mg/dL   Creat 0.74 0.50 - 0.99 mg/dL   eGFR 103 > OR = 60 mL/min/1.18m   BUN/Creatinine Ratio NOT APPLICABLE 6 - 22 (calc)   Sodium 139 135 - 146 mmol/L   Potassium 4.4 3.5 - 5.3 mmol/L   Chloride 104 98 - 110 mmol/L   CO2 26 20 - 32 mmol/L   Calcium 9.0 8.6 - 10.2 mg/dL  TSH  Result Value Ref Range   TSH 2.24 mIU/L   I have personally reviewed the radiology report from 03/22/22 on CT Abd Pelvis .   CT Abdomen Pelvis W IV Contrast  Anatomical Region Laterality Modality  Abdomen -- Computed Tomography  Pelvis -- --   Impression   --Status post small bowel and mesenteric mass resection with small bowel-small bowel anastomosis. Findings concerning for small bowel obstruction at the level of the anastomosis, with several dilated small bowel loops proximal to the anastomosis and decompressed small bowel distal to the anastomosis.   --Trace pneumoperitoneum and small volume abdominopelvic ascites with mild mesenteric stranding, likely reflects postsurgical changes.   --Previously seen thrombosis in the superior mesenteric vein is no longer visualized following mass resection, with patency of the smaller tributary veins.   --Sequelae of cholecystectomy.   The preliminary findings of this study were discussed via telephone with DR. ADaneil Danby Dr. AGeralynn Ochson 03/22/2022 5:18 AM.   ====================  MODIFIED REPORT:  (03/22/2022  10:45 AM)  This report has been modified from its preliminary version; you may check the prior versions of radiology report, results history link for prior report versions.    ----------------------------------------------- Narrative  EXAM: CT ABDOMEN PELVIS W CONTRAST  DATE: 03/22/2022 3:17 AM  ACCESSION: [id number]  DICTATED: 03/22/2022 4:49 AM  [place]  CLINICAL INDICATION: 46years old with abd pain, bilious vomiting, 4 days post GI mass resection ; Abdominal pain, acute, nonlocalized, patient underwent cholecystectomy, jejunal mesenteric mass and partial small bowel resection on 03/17/2022   COMPARISON: CT abdomen pelvis 03/09/2022   TECHNIQUE: A helical CT scan of the abdomen and pelvis was obtained following IV contrast from the lung bases through the pubic symphysis. Images were reconstructed in the axial plane. Coronal and sagittal reformatted images were also provided for further evaluation.    FINDINGS:   LOWER CHEST: Unremarkable.   LIVER: Normal liver contour. Focal hypoattenuation along the falciform ligament, likely fat deposition. No focal liver lesions.   BILIARY: Surgically absent gallbladder. No biliary ductal dilatation.     SPLEEN: Normal in size and contour.   PANCREAS: Normal pancreatic contour.  No focal lesions.  No ductal dilation.   ADRENAL GLANDS: Normal appearance of the adrenal glands.   KIDNEYS/URETERS: Symmetric renal enhancement.  No hydronephrosis.  No solid renal mass. Scattered subcentimeter hepatic hypodensities, too small to further characterize.   BLADDER: Small volume antidependent gas within the bladder lumen, may reflect recent catheterization.   REPRODUCTIVE ORGANS: Fibroid uterus. No suspicious adnexal masses.   GI TRACT: Sequelae of  small bowel resection with small bowel-small bowel anastomosis. Mild fecalization of the small bowel contents at the anastomosis, suggesting slow transit. Additionally, the  small bowel loops proximal to the anastomosis are mildly dilated, with collapsed small bowel loops distal to the anastomosis. Normal caliber large bowel. Scattered colonic diverticulosis.   PERITONEUM, RETROPERITONEUM AND MESENTERY: Trace pneumoperitoneum.Mild mesenteric stranding and intra-abdominal/intra-pelvic pelvic free fluid. No discrete, organized fluid collection.   LYMPH NODES: Subcentimeter mesenteric lymph nodes measuring up to 0.7 cm (2:102).   VESSELS: The hepatic veins are patent. The portal vein and splenic vein are patent. Previously seen thrombosis in the superior mesenteric vein is now absent and a portion of the SMV appears to have been resected, although smaller venous tributaries are patent. Normal caliber aorta.     BONES and SOFT TISSUES: No aggressive osseous lesions.  Postsurgical changes of the anterior abdominal wall. Tiny fat-containing umbilical hernia. Procedure Note  Cyndy Freeze, MD - 03/22/2022  Formatting of this note might be different from the original.  EXAM: CT ABDOMEN PELVIS W CONTRAST  DATE: 03/22/2022 3:17 AM  ACCESSION: [id number]  DICTATED: 03/22/2022 4:49 AM  INTERPRETATION LOCATION: [place]   CLINICAL INDICATION: 46 years old with abd pain, bilious vomiting, 4 days post GI mass resection ; Abdominal pain, acute, nonlocalized, patient underwent cholecystectomy, jejunal mesenteric mass and partial small bowel resection on 03/17/2022   COMPARISON: CT abdomen pelvis 03/09/2022   TECHNIQUE: A helical CT scan of the abdomen and pelvis was obtained following IV contrast from the lung bases through the pubic symphysis. Images were reconstructed in the axial plane. Coronal and sagittal reformatted images were also provided for further evaluation.    FINDINGS:   LOWER CHEST: Unremarkable.   LIVER: Normal liver contour. Focal hypoattenuation along the falciform ligament, likely fat deposition. No focal liver lesions.   BILIARY: Surgically  absent gallbladder. No biliary ductal dilatation.     SPLEEN: Normal in size and contour.   PANCREAS: Normal pancreatic contour.  No focal lesions.  No ductal dilation.   ADRENAL GLANDS: Normal appearance of the adrenal glands.   KIDNEYS/URETERS: Symmetric renal enhancement.  No hydronephrosis.  No solid renal mass. Scattered subcentimeter hepatic hypodensities, too small to further characterize.   BLADDER: Small volume antidependent gas within the bladder lumen, may reflect recent catheterization.   REPRODUCTIVE ORGANS: Fibroid uterus. No suspicious adnexal masses.   GI TRACT: Sequelae of small bowel resection with small bowel-small bowel anastomosis. Mild fecalization of the small bowel contents at the anastomosis, suggesting slow transit. Additionally, the small bowel loops proximal to the anastomosis are mildly dilated, with collapsed small bowel loops distal to the anastomosis. Normal caliber large bowel. Scattered colonic diverticulosis.   PERITONEUM, RETROPERITONEUM AND MESENTERY: Trace pneumoperitoneum.Mild mesenteric stranding and intra-abdominal/intra-pelvic pelvic free fluid. No discrete, organized fluid collection.   LYMPH NODES: Subcentimeter mesenteric lymph nodes measuring up to 0.7 cm (2:102).   VESSELS: The hepatic veins are patent. The portal vein and splenic vein are patent. Previously seen thrombosis in the superior mesenteric vein is now absent and a portion of the SMV appears to have been resected, although smaller venous tributaries are patent. Normal caliber aorta.     BONES and SOFT TISSUES: No aggressive osseous lesions.  Postsurgical changes of the anterior abdominal wall. Tiny fat-containing umbilical hernia.    IMPRESSION:   --Status post small bowel and mesenteric mass resection with small bowel-small bowel anastomosis. Findings concerning for small bowel obstruction at the level of the anastomosis, with  several dilated small bowel loops proximal to the  anastomosis and decompressed small bowel distal to the anastomosis.   --Trace pneumoperitoneum and small volume abdominopelvic ascites with mild mesenteric stranding, likely reflects postsurgical changes.   --Previously seen thrombosis in the superior mesenteric vein is no longer visualized following mass resection, with patency of the smaller tributary veins.   --Sequelae of cholecystectomy.   The preliminary findings of this study were discussed via telephone with DR. Daneil Dan by Dr. Geralynn Ochs on 03/22/2022 5:18 AM.   ====================  MODIFIED REPORT:  (03/22/2022 10:45 AM)  This report has been modified from its preliminary version; you may check the prior versions of radiology report, results history link for prior report versions.    ----------------------------------------------- Exam End: 03/22/22 03:17   Specimen Collected: 03/22/22 04:49 Last Resulted: 03/22/22 10:47  Received From: [facility]  Result Received: 05/11/22 08:20       Assessment & Plan:   Problem List Items Addressed This Visit     GAD (generalized anxiety disorder) - Primary   Relevant Medications   busPIRone (BUSPAR) 5 MG tablet   Major depressive disorder, recurrent, in partial remission (HCC)   Relevant Medications   busPIRone (BUSPAR) 5 MG tablet   Morbid obesity ([place])   Other Visit Diagnoses     S/P cholecystectomy       Mesenteric mass       Thrombosis of mesenteric vein (HCC)       Relevant Medications   ELIQUIS 5 MG TABS tablet   Menorrhagia with irregular cycle       Relevant Orders   CBC with Differential/Platelet   Uterine leiomyoma, unspecified location       Relevant Orders   CBC with Differential/Platelet       Mesenteric mass s/p resection Reviewed hospital records [facility] Onc Surgery Pathology is benign CT image from 6/7 reviewed  She will need repeat imaging for surveillance at interval of 3-6 months from now as requested by her [facility] Onc/Surgeon Dr Carmin Muskrat.  I have sent an Epic chart outside staff message to Dr Maudie Mercury to correspond on this request and coordinate our surveillance. Once I review the plan with Dr Maudie Mercury I can order the requested imaging (likely CT Abd Pelvis w IV contrast) and we can schedule for December 2023 and will make it available to Dr Maudie Mercury as well, however as patient confirms - she is no longer needing to follow up with Oncology since the mass was  benign.  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  Will increase dose Buspar from 5 mg BID to TID dosing to avoid loss of effect during afternoon.  Meds ordered this encounter  Medications   busPIRone (BUSPAR) 5 MG tablet    Sig: Take 1 tablet (5 mg total) by mouth 3 (three) times daily.    Dispense:  270 tablet  Refill:  3      Follow up plan: Return in about 5 months (around 10/11/2022) for 5 month follow-up Imaging results.  [name], [facility] 05/11/2022, 9:18 AM

## 2022-05-11 NOTE — Patient Instructions (Addendum)
Thank you for coming to the office today.  We will coordinate with Dr Maudie Mercury to arrange orders for imaging repeat. And update them.  Stay tuned for scheduling.  BP is normal  Monitor at work goal < 140/90  Dose inc buspar to '5mg'$  3 times a day  Please schedule a Follow-up Appointment to: Return in about 5 months (around 10/11/2022) for 5 month follow-up Imaging results.  If you have any other questions or concerns, please feel free to call the office or send a message through [place]. You may also schedule an earlier appointment if necessary.  Additionally, you may be receiving a survey about your experience at our office within a few days to 1 week by e-mail or mail. We value your feedback.  [name], DO [place]

## 2022-05-15 ENCOUNTER — Encounter: Payer: Self-pay | Admitting: Family Medicine

## 2022-07-17 ENCOUNTER — Encounter (INDEPENDENT_AMBULATORY_CARE_PROVIDER_SITE_OTHER): Payer: BC Managed Care – PPO | Admitting: Family Medicine

## 2022-07-17 DIAGNOSIS — T753XXA Motion sickness, initial encounter: Secondary | ICD-10-CM

## 2022-07-18 MED ORDER — SCOPOLAMINE 1 MG/3DAYS TD PT72: 1.0000 | MEDICATED_PATCH | TRANSDERMAL | 0 refills | Status: DC

## 2022-07-18 NOTE — Telephone Encounter (Signed)

## 2022-09-26 ENCOUNTER — Telehealth: Payer: BC Managed Care – PPO | Admitting: Physician Assistant

## 2022-09-26 ENCOUNTER — Ambulatory Visit: Payer: Self-pay | Admitting: *Deleted

## 2022-09-26 DIAGNOSIS — U071 COVID-19: Secondary | ICD-10-CM | POA: Diagnosis not present

## 2022-09-26 MED ORDER — ALBUTEROL SULFATE HFA 108 (90 BASE) MCG/ACT IN AERS: 1.0000 | INHALATION_SPRAY | Freq: Four times a day (QID) | RESPIRATORY_TRACT | 0 refills | Status: AC | PRN

## 2022-09-26 MED ORDER — FLUTICASONE PROPIONATE 50 MCG/ACT NA SUSP: 2.0000 | Freq: Every day | NASAL | 0 refills | Status: DC

## 2022-09-26 MED ORDER — PROMETHAZINE-DM 6.25-15 MG/5ML PO SYRP: 5.0000 mL | ORAL_SOLUTION | Freq: Four times a day (QID) | ORAL | 0 refills | Status: DC | PRN

## 2022-09-26 MED ORDER — MOLNUPIRAVIR EUA 200MG CAPSULE: 4.0000 | ORAL_CAPSULE | Freq: Two times a day (BID) | ORAL | 0 refills | Status: AC

## 2022-09-26 MED ORDER — BENZONATATE 100 MG PO CAPS: 100.0000 mg | ORAL_CAPSULE | Freq: Three times a day (TID) | ORAL | 0 refills | Status: DC | PRN

## 2022-09-26 NOTE — Telephone Encounter (Signed)
  Chief Complaint: cough, chest congestion/tightness Symptoms: wheezing, cough, SOB with exertion Frequency: 2 weeks- negative COVID testing Pertinent Negatives: Patient denies   Disposition: '[]'$ ED /'[x]'$ Urgent Care (no appt availability in office) / '[]'$ Appointment(In office/virtual)/ '[]'$  [place] [facility]/ '[]'$ Home Care/ '[]'$ Refused Recommended Disposition /'[]'$ [place] [facility]/ '[]'$  Follow-up with PCP Additional Notes: No open appointment- unable to contact office- VM- for possible work in. Advised UC for evaluation    System/EPIC is down- will try to edit protocol at later time- patient has been dispositioned to UC Reason for Disposition . [1] MILD difficulty breathing (e.g., minimal/no SOB at rest, SOB with walking, pulse <100) AND [2] NEW-onset or WORSE than normal  Answer Assessment - Initial Assessment Questions 1. RESPIRATORY STATUS: "Describe your breathing?" (e.g., wheezing, shortness of breath, unable to speak, severe coughing)      SOB-wheezing, cough,congestion 2. ONSET: "When did this breathing problem begin?"      2 weeks 3. PATTERN "Does the difficult breathing come and go, or has it been constant since it started?"      Comes and goes 4. SEVERITY: "How bad is your breathing?" (e.g., mild, moderate, severe)    - MILD: No SOB at rest, mild SOB with walking, speaks normally in sentences, can lie down, no retractions, pulse < 100.    - MODERATE: SOB at rest, SOB with minimal exertion and prefers to sit, cannot lie down flat, speaks in phrases, mild retractions, audible wheezing, pulse 100-120.    - SEVERE: Very SOB at rest, speaks in single words, struggling to breathe, sitting hunched forward, retractions, pulse > 120      mild   8. CAUSE: "What do you think is causing the breathing problem?"      URI- negative COVID testing 9. OTHER SYMPTOMS: "Do you have any other symptoms? (e.g., dizziness, runny nose, cough, chest pain, fever)     Congestion, cough  Protocols used:  Breathing Difficulty-A-AH

## 2022-09-26 NOTE — Telephone Encounter (Signed)
Actually 12/18 is a routine apt for me.  She was already seen today by [facility] Virtual Care on 12/12  [name], [facility] 09/26/2022, 4:55 PM

## 2022-09-26 NOTE — Progress Notes (Signed)
Virtual Visit Consent   Elizabeth Schultz, you are scheduled for a virtual visit with a [facility] provider today. Just as with appointments in the office, your consent must be obtained to participate. Your consent will be active for this visit and any virtual visit you may have with one of our providers in the next 365 days. If you have a MyChart account, a copy of this consent can be sent to you electronically.  As this is a virtual visit, video technology does not allow for your provider to perform a traditional examination. This may limit your provider's ability to fully assess your condition. If your provider identifies any concerns that need to be evaluated in person or the need to arrange testing (such as labs, EKG, etc.), we will make arrangements to do so. Although advances in technology are sophisticated, we cannot ensure that it will always work on either your end or our end. If the connection with a video visit is poor, the visit may have to be switched to a telephone visit. With either a video or telephone visit, we are not always able to ensure that we have a secure connection.  By engaging in this virtual visit, you consent to the provision of healthcare and authorize for your insurance to be billed (if applicable) for the services provided during this visit. Depending on your insurance coverage, you may receive a charge related to this service.  I need to obtain your verbal consent now. Are you willing to proceed with your visit today? ALITZEL COOKSON has provided verbal consent on 09/26/2022 for a virtual visit (video or telephone). Mar Daring, PA-C  Date: 09/26/2022 9:31 AM  Virtual Visit via Video Note   I, Mar Daring, connected with  Elizabeth Schultz  ([id number], Dec 24, 1975) on 09/26/22 at  9:30 AM EST by a video-enabled telemedicine application and verified that I am speaking with the correct person using two identifiers.  Location: Patient: Virtual Visit  Location Patient: Home Provider: Virtual Visit Location Provider: Home Office   I discussed the limitations of evaluation and management by telemedicine and the availability of in person appointments. The patient expressed understanding and agreed to proceed.    History of Present Illness: Elizabeth Schultz is a 46 y.o. who identifies as a female who was assigned female at birth, and is being seen today for Covid 39.  HPI: URI  This is a new problem. Episode onset: Tested positive for Covid 19 on at home test this morning; Symptoms started a few days ago. The problem has been gradually worsening. There has been no fever. Associated symptoms include chest pain (tightness), congestion, coughing, ear pain, headaches, neck pain, a plugged ear sensation, rhinorrhea, sinus pain, a sore throat and wheezing. Pertinent negatives include no diarrhea, nausea or vomiting. Associated symptoms comments: Chills and body aches. Treatments tried: mucinex, sudafed. The treatment provided no relief.     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   GAD (generalized anxiety disorder) 04/04/2021    Allergies:  Allergies  Allergen Reactions   Zolpidem    Medications:  Current Outpatient Medications:    albuterol (VENTOLIN HFA) 108 (90 Base) MCG/ACT inhaler, Inhale 1-2 puffs into the lungs every 6 (six) hours as needed., Disp: 8 g, Rfl: 0   benzonatate (TESSALON) 100 MG capsule, Take 1 capsule (100 mg total) by mouth  3 (three) times daily as needed., Disp: 30 capsule, Rfl: 0   fluticasone (FLONASE) 50 MCG/ACT nasal spray, Place 2 sprays into both nostrils daily., Disp: 16 g, Rfl: 0   molnupiravir EUA (LAGEVRIO) 200 mg CAPS capsule, Take 4 capsules (800 mg total) by mouth 2 (two) times daily for 5 days., Disp: 40 capsule, Rfl: 0   promethazine-dextromethorphan (PROMETHAZINE-DM)  6.25-15 MG/5ML syrup, Take 5 mLs by mouth 4 (four) times daily as needed., Disp: 118 mL, Rfl: 0   busPIRone (BUSPAR) 5 MG tablet, Take 1 tablet (5 mg total) by mouth 3 (three) times daily., Disp: 270 tablet, Rfl: 3   ELIQUIS 5 MG TABS tablet, Take 5 mg by mouth 2 (two) times daily., Disp: , Rfl:    escitalopram (LEXAPRO) 10 MG tablet, Take 1 tablet (10 mg total) by mouth daily., Disp: 90 tablet, Rfl: 3   ibuprofen (ADVIL) 600 MG tablet, Take by mouth., Disp: , Rfl:    Insulin Pen Needle 31G X 8 MM MISC, Use with saxenda injection once daily, Disp: 90 each, Rfl: 3   omeprazole (PRILOSEC) 20 MG capsule, Take 20 mg by mouth daily., Disp: , Rfl:    scopolamine (TRANSDERM-SCOP) 1 MG/3DAYS, Place 1 patch (1.5 mg total) onto the skin every 3 (three) days. For motion sickness. Start 2 hr before onset symptoms, up to 12 hr before, Disp: 10 patch, Rfl: 0  Observations/Objective: Patient is well-developed, well-nourished in no acute distress.  Resting comfortably at home.  Head is normocephalic, atraumatic.  No labored breathing.  Speech is clear and coherent with logical content.  Patient is alert and oriented at baseline.    Assessment and Plan: 1. COVID-19 - MyChart COVID-19 home monitoring program; Future - molnupiravir EUA (LAGEVRIO) 200 mg CAPS capsule; Take 4 capsules (800 mg total) by mouth 2 (two) times daily for 5 days.  Dispense: 40 capsule; Refill: 0 - albuterol (VENTOLIN HFA) 108 (90 Base) MCG/ACT inhaler; Inhale 1-2 puffs into the lungs every 6 (six) hours as needed.  Dispense: 8 g; Refill: 0 - fluticasone (FLONASE) 50 MCG/ACT nasal spray; Place 2 sprays into both nostrils daily.  Dispense: 16 g; Refill: 0 - promethazine-dextromethorphan (PROMETHAZINE-DM) 6.25-15 MG/5ML syrup; Take 5 mLs by mouth 4 (four) times daily as needed.  Dispense: 118 mL; Refill: 0 - benzonatate (TESSALON) 100 MG capsule; Take 1 capsule (100 mg total) by mouth 3 (three) times daily as needed.  Dispense: 30  capsule; Refill: 0  - Continue OTC symptomatic management of choice - Will send OTC vitamins and supplement information through AVS - Molnupiravir prescribed - Albuterol for wheezing - Flonase for nasal congestion - Promethazine DM and Tessalon perles for cough - Patient enrolled in MyChart symptom monitoring - Push fluids - Rest as needed - Discussed return precautions and when to seek in-person evaluation, sent via AVS as well   Follow Up Instructions: I discussed the assessment and treatment plan with the patient. The patient was provided an opportunity to ask questions and all were answered. The patient agreed with the plan and demonstrated an understanding of the instructions.  A copy of instructions were sent to the patient via MyChart unless otherwise noted below.    The patient was advised to call back or seek an in-person evaluation if the symptoms worsen or if the condition fails to improve as anticipated.  Time:  I spent 10 minutes with the patient via telehealth technology discussing the above problems/concerns.    Mar Daring, PA-C

## 2022-09-26 NOTE — Patient Instructions (Signed)
Ellan Lambert, thank you for joining Mar Daring, PA-C for today's virtual visit.  While this provider is not your primary care provider (PCP), if your PCP is located in our provider database this encounter information will be shared with them immediately following your visit.   [facility] account gives you access to today's visit and all your visits, tests, and labs performed at [facility] " click here if you don't have a [place] account or go to [email or web address]  Consent: (Patient) Elizabeth Schultz provided verbal consent for this virtual visit at the beginning of the encounter.  Current Medications:  Current Outpatient Medications:    albuterol (VENTOLIN HFA) 108 (90 Base) MCG/ACT inhaler, Inhale 1-2 puffs into the lungs every 6 (six) hours as needed., Disp: 8 g, Rfl: 0   benzonatate (TESSALON) 100 MG capsule, Take 1 capsule (100 mg total) by mouth 3 (three) times daily as needed., Disp: 30 capsule, Rfl: 0   fluticasone (FLONASE) 50 MCG/ACT nasal spray, Place 2 sprays into both nostrils daily., Disp: 16 g, Rfl: 0   molnupiravir EUA (LAGEVRIO) 200 mg CAPS capsule, Take 4 capsules (800 mg total) by mouth 2 (two) times daily for 5 days., Disp: 40 capsule, Rfl: 0   promethazine-dextromethorphan (PROMETHAZINE-DM) 6.25-15 MG/5ML syrup, Take 5 mLs by mouth 4 (four) times daily as needed., Disp: 118 mL, Rfl: 0   busPIRone (BUSPAR) 5 MG tablet, Take 1 tablet (5 mg total) by mouth 3 (three) times daily., Disp: 270 tablet, Rfl: 3   ELIQUIS 5 MG TABS tablet, Take 5 mg by mouth 2 (two) times daily., Disp: , Rfl:    escitalopram (LEXAPRO) 10 MG tablet, Take 1 tablet (10 mg total) by mouth daily., Disp: 90 tablet, Rfl: 3   ibuprofen (ADVIL) 600 MG tablet, Take by mouth., Disp: , Rfl:    Insulin Pen Needle 31G X 8 MM MISC, Use with saxenda injection once daily, Disp: 90 each, Rfl: 3   omeprazole (PRILOSEC) 20 MG capsule, Take 20 mg by mouth daily.,  Disp: , Rfl:    scopolamine (TRANSDERM-SCOP) 1 MG/3DAYS, Place 1 patch (1.5 mg total) onto the skin every 3 (three) days. For motion sickness. Start 2 hr before onset symptoms, up to 12 hr before, Disp: 10 patch, Rfl: 0   Medications ordered in this encounter:  Meds ordered this encounter  Medications   molnupiravir EUA (LAGEVRIO) 200 mg CAPS capsule    Sig: Take 4 capsules (800 mg total) by mouth 2 (two) times daily for 5 days.    Dispense:  40 capsule    Refill:  0    Order Specific Question:   Supervising Provider    Answer:   Chase Picket [[id number]]   albuterol (VENTOLIN HFA) 108 (90 Base) MCG/ACT inhaler    Sig: Inhale 1-2 puffs into the lungs every 6 (six) hours as needed.    Dispense:  8 g    Refill:  0    Order Specific Question:   Supervising Provider    Answer:   Chase Picket [[id number]]   fluticasone (FLONASE) 50 MCG/ACT nasal spray    Sig: Place 2 sprays into both nostrils daily.    Dispense:  16 g    Refill:  0    Order Specific Question:   Supervising Provider    Answer:   Chase Picket [id number]   promethazine-dextromethorphan (PROMETHAZINE-DM) 6.25-15 MG/5ML syrup    Sig: Take 5 mLs by mouth 4 (four) times  daily as needed.    Dispense:  118 mL    Refill:  0    Order Specific Question:   Supervising Provider    Answer:   Chase Picket [[id number]]   benzonatate (TESSALON) 100 MG capsule    Sig: Take 1 capsule (100 mg total) by mouth 3 (three) times daily as needed.    Dispense:  30 capsule    Refill:  0    Order Specific Question:   Supervising Provider    Answer:   Chase Picket [id number]     *If you need refills on other medications prior to your next appointment, please contact your pharmacy*  Follow-Up: Call back or seek an in-person evaluation if the symptoms worsen or if the condition fails to improve as anticipated.  [place] [phone]  Other Instructions  COVID-19 COVID-19, or coronavirus disease 2019, is an  infection that is caused by a new (novel) coronavirus called SARS-CoV-2. COVID-19 can cause many symptoms. In some people, the virus may not cause any symptoms. In others, it may cause mild or severe symptoms. Some people with severe infection develop severe disease. What are the causes? This illness is caused by a virus. The virus may be in the air as tiny specks of fluid (aerosols) or droplets, or it may be on surfaces. You may catch the virus by: Breathing in droplets from an infected person. Droplets can be spread by a person breathing, speaking, singing, coughing, or sneezing. Touching something, like a table or a doorknob, that has virus on it (is contaminated) and then touching your mouth, nose, or eyes. What increases the risk? Risk for infection: You are more likely to get infected with the COVID-19 virus if: You are within 6 ft (1.8 m) of a person with COVID-19 for 15 minutes or longer. You are providing care for a person who is infected with COVID-19. You are in close personal contact with other people. Close personal contact includes hugging, kissing, or sharing eating or drinking utensils. Risk for serious illness caused by COVID-19: You are more likely to get seriously ill from the COVID-19 virus if: You have cancer. You have a long-term (chronic) disease, such as: Chronic lung disease. This includes pulmonary embolism, chronic obstructive pulmonary disease, and cystic fibrosis. Long-term disease that lowers your body's ability to fight infection (immunocompromise). Serious cardiac conditions, such as heart failure, coronary artery disease, or cardiomyopathy. Diabetes. Chronic kidney disease. Liver diseases. These include cirrhosis, nonalcoholic fatty liver disease, alcoholic liver disease, or autoimmune hepatitis. You have obesity. You are pregnant or were recently pregnant. You have sickle cell disease. What are the signs or symptoms? Symptoms of this condition can range from  mild to severe. Symptoms may appear any time from 2 to 14 days after being exposed to the virus. They include: Fever or chills. Shortness of breath or trouble breathing. Feeling tired or very tired. Headaches, body aches, or muscle aches. Runny or stuffy nose, sneezing, coughing, or sore throat. New loss of taste or smell. This is rare. Some people may also have stomach problems, such as nausea, vomiting, or diarrhea. Other people may not have any symptoms of COVID-19. How is this diagnosed? This condition may be diagnosed by testing samples to check for the COVID-19 virus. The most common tests are the PCR test and the antigen test. Tests may be done in the lab or at home. They include: Using a swab to take a sample of fluid from the back  of your nose and throat (nasopharyngeal fluid), from your nose, or from your throat. Testing a sample of saliva from your mouth. Testing a sample of coughed-up mucus from your lungs (sputum). How is this treated? Treatment for COVID-19 infection depends on the severity of the condition. Mild symptoms can be managed at home with rest, fluids, and over-the-counter medicines. Serious symptoms may be treated in a hospital intensive care unit (ICU). Treatment in the ICU may include: Supplemental oxygen. Extra oxygen is given through a tube in the nose, a face mask, or a hood. Medicines. These may include: Antivirals, such as monoclonal antibodies. These help your body fight off certain viruses that can cause disease. Anti-inflammatories, such as corticosteroids. These reduce inflammation and suppress the immune system. Antithrombotics. These prevent or treat blood clots, if they develop. Convalescent plasma. This helps boost your immune system, if you have an underlying immunosuppressive condition or are getting immunosuppressive treatments. Prone positioning. This means you will lie on your stomach. This helps oxygen to get into your lungs. Infection control  measures. If you are at risk for more serious illness caused by COVID-19, your health care provider may prescribe two long-acting monoclonal antibodies, given together every 6 months. How is this prevented? To protect yourself: Use preventive medicine (pre-exposure prophylaxis). You may get pre-exposure prophylaxis if you have moderate or severe immunocompromise. Get vaccinated. Anyone 62 months old or older who meets guidelines can get a COVID-19 vaccine or vaccine series. This includes people who are pregnant or making breast milk (lactating). Get an added dose of COVID-19 vaccine after your first vaccine or vaccine series if you have moderate to severe immunocompromise. This applies if you have had a solid organ transplant or have been diagnosed with an immunocompromising condition. You should get the added dose 4 weeks after you got the first COVID-19 vaccine or vaccine series. If you get an mRNA vaccine, you will need a 3-dose primary series. If you get the J&J/Janssen vaccine, you will need a 2-dose primary series, with the second dose being an mRNA vaccine. Talk to your health care provider about getting experimental monoclonal antibodies. This treatment is approved under emergency use authorization to prevent severe illness before or after being exposed to the COVID-19 virus. You may be given monoclonal antibodies if: You have moderate or severe immunocompromise. This includes treatments that lower your immune response. People with immunocompromise may not develop protection against COVID-19 when they are vaccinated. You cannot be vaccinated. You may not get a vaccine if you have a severe allergic reaction to the vaccine or its components. You are not fully vaccinated. You are in a facility where COVID-19 is present and: Are in close contact with a person who is infected with the COVID-19 virus. Are at high risk of being exposed to the COVID-19 virus. You are at risk of illness from new  variants of the COVID-19 virus. To protect others: If you have symptoms of COVID-19, take steps to prevent the virus from spreading to others. Stay home. Leave your house only to get medical care. Do not use public transit, if possible. Do not travel while you are sick. Wash your hands often with soap and water for at least 20 seconds. If soap and water are not available, use alcohol-based hand sanitizer. Make sure that all people in your household wash their hands well and often. Cough or sneeze into a tissue or your sleeve or elbow. Do not cough or sneeze into your hand or into the air.  Where to find more information Centers for Disease Control and Prevention: CharmCourses.be World Health Organization: https://www.castaneda.info/ Get help right away if: You have trouble breathing. You have pain or pressure in your chest. You are confused. You have bluish lips and fingernails. You have trouble waking from sleep. You have symptoms that get worse. These symptoms may be an emergency. Get help right away. Call 911. Do not wait to see if the symptoms will go away. Do not drive yourself to the hospital. Summary COVID-19 is an infection that is caused by a new coronavirus. Sometimes, there are no symptoms. Other times, symptoms range from mild to severe. Some people with a severe COVID-19 infection develop severe disease. The virus that causes COVID-19 can spread from person to person through droplets or aerosols from breathing, speaking, singing, coughing, or sneezing. Mild symptoms of COVID-19 can be managed at home with rest, fluids, and over-the-counter medicines. This information is not intended to replace advice given to you by your health care provider. Make sure you discuss any questions you have with your health care provider. Document Revised: 09/20/2021 Document Reviewed: 09/22/2021 Elsevier Patient Education  [place].    If you have been instructed  to have an in-person evaluation today at a local Urgent Care facility, please use the link below. It will take you to a list of all of our available [facility], including address, phone number and hours of operation. Please do not delay care.  [facility]  If you or a family member do not have a primary care provider, use the link below to schedule a visit and establish care. When you choose a [facility] primary care physician or advanced practice provider, you gain a long-term partner in health. Find a Primary Care Provider  Learn more about [facility] in-office and virtual care options: [facility]

## 2022-09-26 NOTE — Telephone Encounter (Signed)
PT called back. Pt is COVID +. Pt states she feels too poorly to go to UC. Pt accepted VV.

## 2022-09-27 ENCOUNTER — Other Ambulatory Visit: Payer: Self-pay | Admitting: Family Medicine

## 2022-09-27 DIAGNOSIS — F3341 Major depressive disorder, recurrent, in partial remission: Secondary | ICD-10-CM

## 2022-09-27 DIAGNOSIS — F411 Generalized anxiety disorder: Secondary | ICD-10-CM

## 2022-09-27 NOTE — Telephone Encounter (Signed)
Requested medication (s) are due for refill today: expired medication  Requested medication (s) are on the active medication list: yes  Last refill:  08/08/21 #16g 0 refills  Future visit scheduled: yes in 5 days  Notes to clinic:  expired medication do you want to renew Rx?     Requested Prescriptions  Pending Prescriptions Disp Refills   escitalopram (LEXAPRO) 10 MG tablet [Pharmacy Med Name: ESCITALOPRAM 10 MG TABLET] 90 tablet 3    Sig: TAKE 1 TABLET BY MOUTH EVERY DAY     Psychiatry:  Antidepressants - SSRI Passed - 09/27/2022 11:43 AM      Passed - Completed PHQ-2 or PHQ-9 in the last 360 days      Passed - Valid encounter within last 6 months    Recent Outpatient Visits           4 months ago GAD (generalized anxiety disorder)   [place], DO   10 months ago Morbid obesity [facility])   [place], DO   1 year ago Major depressive disorder, recurrent, in partial remission [facility])   [place], DO   1 year ago Major depressive disorder, recurrent, moderate ([place])   [place], DO   1 year ago Breast lump on right side at 3 o'clock position   [place], Devonne Doughty, DO       Future Appointments             In 5 days [place], Devonne Doughty, [facility], [facility]

## 2022-10-02 ENCOUNTER — Ambulatory Visit (INDEPENDENT_AMBULATORY_CARE_PROVIDER_SITE_OTHER): Payer: BC Managed Care – PPO | Admitting: Family Medicine

## 2022-10-02 ENCOUNTER — Encounter: Payer: Self-pay | Admitting: Family Medicine

## 2022-10-02 VITALS — BP 128/84 | HR 72 | Ht 64.0 in | Wt 247.0 lb

## 2022-10-02 DIAGNOSIS — N921 Excessive and frequent menstruation with irregular cycle: Secondary | ICD-10-CM | POA: Diagnosis not present

## 2022-10-02 DIAGNOSIS — F331 Major depressive disorder, recurrent, moderate: Secondary | ICD-10-CM | POA: Diagnosis not present

## 2022-10-02 DIAGNOSIS — K219 Gastro-esophageal reflux disease without esophagitis: Secondary | ICD-10-CM

## 2022-10-02 DIAGNOSIS — Z23 Encounter for immunization: Secondary | ICD-10-CM | POA: Diagnosis not present

## 2022-10-02 DIAGNOSIS — F411 Generalized anxiety disorder: Secondary | ICD-10-CM

## 2022-10-02 MED ORDER — OMEPRAZOLE 20 MG PO CPDR: 20.0000 mg | DELAYED_RELEASE_CAPSULE | Freq: Every day | ORAL | 3 refills | Status: DC

## 2022-10-02 NOTE — Patient Instructions (Addendum)
Thank you for coming to the office today.  Keep in touch on the [facility] records, with upcoming procedure.  You already should have refills available for Lexapro at the pharmacy.  I sent rx Omeprazole  Permission to double dose Buspar can take '5mg'$  x 2 = '10mg'$  for any of the doses would be fine.  DUE for FASTING BLOOD WORK (no food or drink after midnight before the lab appointment, only water or coffee without cream/sugar on the morning of)  Next visit, we can do labs AFTER  Please schedule a Follow-up Appointment to: Return in about 6 months (around 04/03/2023) for 6 month Annual Physical fasting AM lab after.  If you have any other questions or concerns, please feel free to call the office or send a message through [facility]. You may also schedule an earlier appointment if necessary.  Additionally, you may be receiving a survey about your experience at our office within a few days to 1 week by e-mail or mail. We value your feedback.  [name], DO [place]

## 2022-10-02 NOTE — Progress Notes (Unsigned)
Subjective:    Patient ID: Elizabeth Schultz, female    DOB: May 11, 1976, 46 y.o.   MRN: [id number]  Elizabeth Schultz is a 46 y.o. female presenting on 10/02/2022 for Anxiety and Depression   HPI  Mesenteric mass s/p excision Cholelithiasis s/p cholecystectomy  Small bowel resection History of EOE, has had upper endoscopy   Seen by [facility], she had abdominal pain, and they were concerned and sent her to [facility] ED, she was found on scans to find a mesenteric soft tissue mass pressing down on mesenteric vessels and caused a blood clot, she had gallstones and she pursued surgery for Cholecystectomy and soft tissue mass was removed and it was unknown origin and determined to not be cancerous. Pathology was benign (benign hyalinized spindle cell proliferation w/ lymph inflammation), lymph node resection, partial small bowel resection. All negative for malignancy   No more oncology follow-up On Eliquis due to the blood clot.   She was seen 05/05/22 by OBGYN. They checked 3 times at the office and it was elevated 166/95. She describes anxiety, they did prior scan that found 4 uterine fibroids. She is a good candidate for hysterectomy but reconsider in future. She would consider Uterine Fibroid Embolization (UFE) by IR, she has severe menstrual cycles and heavy blood flow.  Follow-up now 5 months later, she has had further imaging from [facility] and seen [facility] and they have scheduled embolization for uterine fibroids on 11/28/22  - She was given Lupron injection to interfere with the menstrual cycle due to anemia and menstrual bleeding. She had previous too low iron and she had a transfusion, she had to wait so many days or weeks after iron infusion to get the MRI.    Anxiety She is improved on current meds but feels Buspar is not lasting during the day, she takes '5mg'$  BID currently.  Taking Buspar '5mg'$  THREE TIMES A DAY AS NEEDED not even once or twice a week, but when needs to take it,  she will use it and it is helpful, but may not last very long. - Also on Escitalopram '10mg'$  daily for mood and anxiety. - Admits improvement and she can control her mood overall.       10/02/2022    8:32 AM 05/11/2022    9:04 AM 11/11/2021    8:36 AM  Depression screen PHQ 2/9  Decreased Interest 1 0 0  Down, Depressed, Hopeless 1 1 0  PHQ - 2 Score 2 1 0  Altered sleeping 2 2 0  Tired, decreased energy 2 2 0  Change in appetite 2 1 0  Feeling bad or failure about yourself  '1 2 1  '$ Trouble concentrating 0 0 0  Moving slowly or fidgety/restless 0 0 0  Suicidal thoughts 0 0 0  PHQ-9 Score '9 8 1  '$ Difficult doing work/chores Not difficult at all Not difficult at all Not difficult at all      10/02/2022    8:33 AM 05/11/2022    9:04 AM 11/11/2021    8:37 AM 08/08/2021    8:53 AM  GAD 7 : Generalized Anxiety Score  Nervous, Anxious, on Edge 2 1 0 0  Control/stop worrying 2 1 0 0  Worry too much - different things 1 2 0 0  Trouble relaxing 1 2 0 0  Restless 0 '1 1 1  '$ Easily annoyed or irritable 1 0 0 0  Afraid - awful might happen 1 2 0 0  Total GAD 7 Score '8 9 1 1  '$ Anxiety Difficulty Not difficult at all Not difficult at all Somewhat difficult Somewhat difficult     Social History   Tobacco Use   Smoking status: Never   Smokeless tobacco: Never  Substance Use Topics   Alcohol use: Not Currently   Drug use: Never    Review of Systems Per HPI unless specifically indicated above     Objective:    BP 128/84   Pulse 72   Ht '5\' 4"'$  (1.626 m)   Wt 247 lb (112 kg)   SpO2 98%   BMI 42.40 kg/m   Wt Readings from Last 3 Encounters:  10/02/22 247 lb (112 kg)  05/11/22 229 lb 12.8 oz (104.2 kg)  11/11/21 224 lb 6.4 oz (101.8 kg)    Physical Exam Vitals and nursing note reviewed.  Constitutional:      General: She is not in acute distress.    Appearance: Normal appearance. She is well-developed. She is not diaphoretic.     Comments: Well-appearing, comfortable,  cooperative  HENT:     Head: Normocephalic and atraumatic.  Eyes:     General:        Right eye: No discharge.        Left eye: No discharge.     Conjunctiva/sclera: Conjunctivae normal.  Cardiovascular:     Rate and Rhythm: Normal rate.  Pulmonary:     Effort: Pulmonary effort is normal.  Skin:    General: Skin is warm and dry.     Findings: No erythema or rash.  Neurological:     Mental Status: She is alert and oriented to person, place, and time.  Psychiatric:        Mood and Affect: Mood normal.        Behavior: Behavior normal.        Thought Content: Thought content normal.     Comments: Well groomed, good eye contact, normal speech and thoughts     I have personally reviewed the radiology report from MRI Pelvis on 09/05/22.  MRI Pelvis W Wo Contrast  Anatomical Region Laterality Modality  Pelvis -- Magnetic Resonance   Impression   1. Enlarged fibroid uterus.  2. Unremarkable ovaries.  3. No secondary signs of malignancy in the pelvis.  ____________________________________________________ FIGO classification system of uterine fibroids  FIGO 0: Pedunculated intracavitary FIGO 1: Submucosal, < 50% intramural FIGO 2: Submucosal, > 50% intramural FIGO 3: Contacts endometrium, 100% intramural FIGO 4: Intramural FIGO 5: Subserosal, > 50% intramural FIGO 6: Subserosal, < 50% intramural FIGO 7: Subserosal pedunculated FIGO 8: Other  Reference: Wynelle Link et. al. FIGO Classification system (PALM COEIN) for causes of abnormal uterine bleeding in non gravid women of reproductive age. Obstetrics and Gynecology. Dec 07 2009.  [email or web address] Narrative  EXAM: MRI PELVIS W WO CONTRAST ACCESSION: [id number] UN CLINICAL INDICATION: 46 years old with fibroids ; Fibroids, treatment planning  - D21.9-Fibroids    COMPARISON: CT abdomen pelvis 03/22/2022  TECHNIQUE: MRI of the pelvis was obtained without and with IV contrast. Multisequence,  multiplanar images were obtained.  CONTRAST: 10 mL of  Multihance  FINDINGS:  UTERUS: The uterus is anteverted measuring 8.0 x 10.2 x 7.1  cm.  The endometrium is homogenous measuring 3 mm  The junctional zone measures 3 mm without focal thickening.  The cervix is contains nabothian cyst but is otherwise unremarkable.  FIBROID BURDEN: 1-5.  Dominant fibroids described below:  Fibroid 1 (8:23, 9:17): Left anterior fundal FIGO  4: Intramural fibroid measuring 5.5 x 5.5 x 6.1 cm with homogenous enhancement.  Fibroid 2 (8:11, 9:18):  Right anterior fundal FIGO 4: Intramural fibroid measuring 4.5 x 3.8 x 5.3 cm with homogenous enhancement.  Fibroid 3 (8:17, 9:20):  Midline anterior fundal FIGO 6: Subserosal, < 50% intramural fibroid measuring 0.6 x 0.6 x 0.7 cm with homogenous enhancement.  VAGINA/INTROITUS: The vagina and introitus are normal. No inflammation, cyst or solid mass.  OVARIES: Ovaries are normal in size with small follicles bilaterally.  URINARY SYSTEM: Bladder and distal ureters are unremarkable.  LYMPH NODES: No enlarged lymph nodes.  VESSELS: Normal caliber pelvic vasculature.  BONES/SOFT TISSUES: Unremarkable. Procedure Note  Charlott Rakes, MD - 09/06/2022 Formatting of this note might be different from the original. EXAM: MRI PELVIS W WO CONTRAST ACCESSION: [id number] UN CLINICAL INDICATION: 46 years old with fibroids ; Fibroids, treatment planning  - D21.9-Fibroids    COMPARISON: CT abdomen pelvis 03/22/2022  TECHNIQUE: MRI of the pelvis was obtained without and with IV contrast. Multisequence, multiplanar images were obtained.  CONTRAST: 10 mL of  Multihance  FINDINGS:  UTERUS: The uterus is anteverted measuring 8.0 x 10.2 x 7.1  cm.  The endometrium is homogenous measuring 3 mm  The junctional zone measures 3 mm without focal thickening.  The cervix is contains nabothian cyst but is otherwise unremarkable.  FIBROID BURDEN: 1-5.  Dominant  fibroids described below:  Fibroid 1 (8:23, 9:17): Left anterior fundal FIGO 4: Intramural fibroid measuring 5.5 x 5.5 x 6.1 cm with homogenous enhancement.  Fibroid 2 (8:11, 9:18):  Right anterior fundal FIGO 4: Intramural fibroid measuring 4.5 x 3.8 x 5.3 cm with homogenous enhancement.  Fibroid 3 (8:17, 9:20):  Midline anterior fundal FIGO 6: Subserosal, < 50% intramural fibroid measuring 0.6 x 0.6 x 0.7 cm with homogenous enhancement.  VAGINA/INTROITUS: The vagina and introitus are normal. No inflammation, cyst or solid mass.  OVARIES: Ovaries are normal in size with small follicles bilaterally.  URINARY SYSTEM: Bladder and distal ureters are unremarkable.  LYMPH NODES: No enlarged lymph nodes.  VESSELS: Normal caliber pelvic vasculature.  BONES/SOFT TISSUES: Unremarkable.  IMPRESSION:  1. Enlarged fibroid uterus.  2. Unremarkable ovaries.  3. No secondary signs of malignancy in the pelvis.  ____________________________________________________ FIGO classification system of uterine fibroids  FIGO 0: Pedunculated intracavitary FIGO 1: Submucosal, < 50% intramural FIGO 2: Submucosal, > 50% intramural FIGO 3: Contacts endometrium, 100% intramural FIGO 4: Intramural FIGO 5: Subserosal, > 50% intramural FIGO 6: Subserosal, < 50% intramural FIGO 7: Subserosal pedunculated FIGO 8: Other  Reference: Wynelle Link et. al. FIGO Classification system (PALM COEIN) for causes of abnormal uterine bleeding in non gravid women of reproductive age. Obstetrics and Gynecology. Dec 07 2009.  [email or web address] Exam End: 09/05/22 19:25   Specimen Collected: 09/06/22 09:03 Last Resulted: 09/06/22 10:22  Received From: [facility]  Result Received: 09/26/22 08:08    Results for orders placed or performed in visit on 05/11/22  CBC with Differential/Platelet  Result Value Ref Range   WBC 7.2 3.8 - 10.8 Thousand/uL   RBC 4.51 3.80 - 5.10 Million/uL   Hemoglobin 12.1  11.7 - 15.5 g/dL   HCT 38.6 35.0 - 45.0 %   MCV 85.6 80.0 - 100.0 fL   MCH 26.8 (L) 27.0 - 33.0 pg   MCHC 31.3 (L) 32.0 - 36.0 g/dL   RDW 17.0 (H) 11.0 - 15.0 %   Platelets 339 140 - 400 Thousand/uL   MPV 9.8 7.5 - 12.5  fL   Neutro Abs 4,442 1,500 - 7,800 cells/uL   Lymphs Abs 1,980 850 - 3,900 cells/uL   Absolute Monocytes 547 200 - 950 cells/uL   Eosinophils Absolute 180 15 - 500 cells/uL   Basophils Absolute 50 0 - 200 cells/uL   Neutrophils Relative % 61.7 %   Total Lymphocyte 27.5 %   Monocytes Relative 7.6 %   Eosinophils Relative 2.5 %   Basophils Relative 0.7 %      Assessment & Plan:   Problem List Items Addressed This Visit     GAD (generalized anxiety disorder) - Primary   Other Visit Diagnoses     Major depressive disorder, recurrent, moderate (HCC)       Menorrhagia with irregular cycle       Gastroesophageal reflux disease without esophagitis       Relevant Medications   omeprazole (PRILOSEC) 20 MG capsule   Needs flu shot       Relevant Orders   Flu Vaccine QUAD 69moIM (Fluarix, Fluzone & Alfiuria Quad PF) (Completed)      Uterine Fibroid Anemia Followed by [facility] / history w [facility] Reviewed prior imaging and follow up plan Upcoming Uterine Fibroid embolization Feb 2024  [facility].  Anxiety Continue Lexapro '10mg'$  daily Use Buspar AS NEEDED anxiety Permission to double dose Buspar can take '5mg'$  x 2 = '10mg'$  for any of the doses would be fine.  GERD Renew PPI   Meds ordered this encounter  Medications   omeprazole (PRILOSEC) 20 MG capsule    Sig: Take 1 capsule (20 mg total) by mouth daily before breakfast.    Dispense:  90 capsule    Refill:  3      Follow up plan: Return in about 6 months (around 04/03/2023) for 6 month Annual Physical fasting AM lab after.  ANobie Putnam [facility] 10/02/2022, 8:36 AM

## 2022-10-03 ENCOUNTER — Encounter: Payer: Self-pay | Admitting: Family Medicine

## 2022-10-07 IMAGING — US US BREAST*R* LIMITED INC AXILLA
1 series · 6 of 6 positions shown · non-contrast
Comparison: None.

CLINICAL DATA: Recent weight loss of 69lbs with newly palpable area
in the RIGHT breast. Strong family history of breast cancer

EXAM:
DIGITAL DIAGNOSTIC BILATERAL MAMMOGRAM WITH TOMOSYNTHESIS AND CAD;
ULTRASOUND RIGHT BREAST LIMITED
TECHNIQUE: Bilateral digital diagnostic mammography and breast tomosynthesis
was performed. The images were evaluated with computer-aided
detection.; Targeted ultrasound examination of the right breast was
performed

[Series 1: us breast*right* limited inc axilla · 0.07mm/px · 6 of 6 slices shown]
[im 1/6]
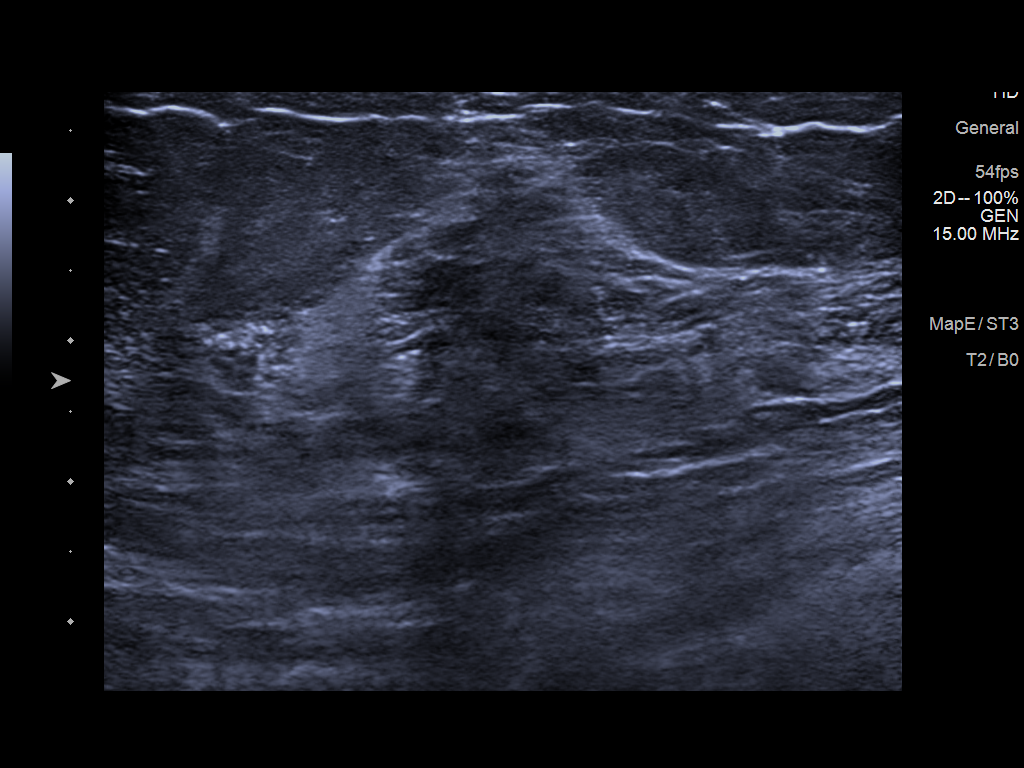
[im 2/6]
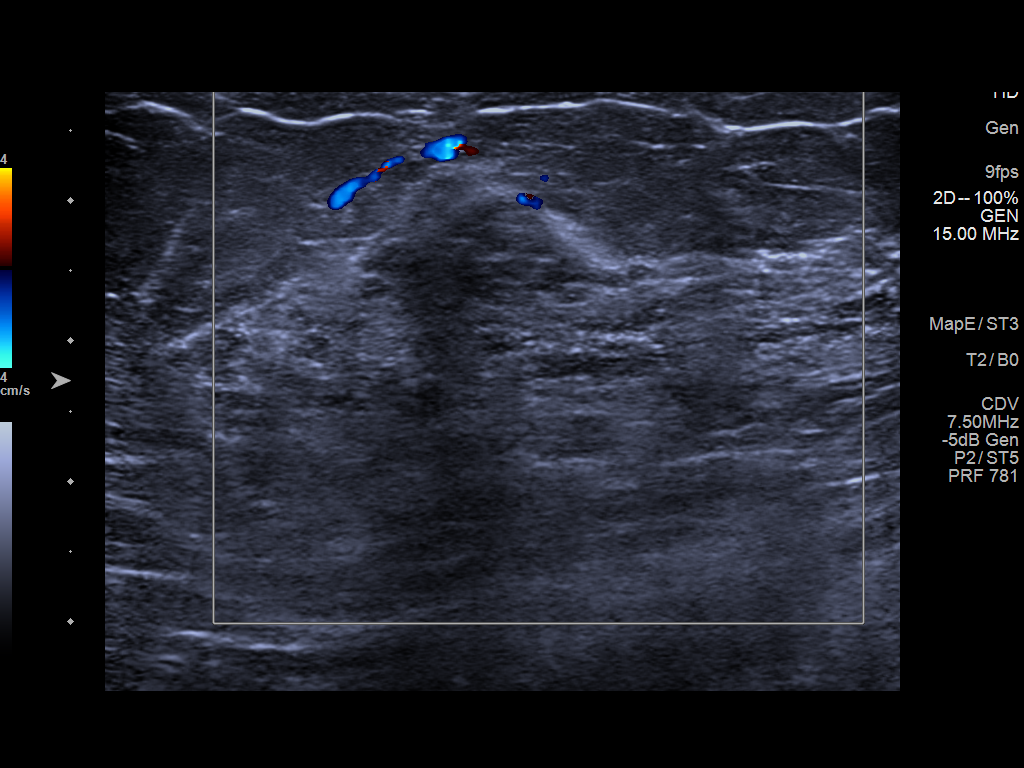
[im 3/6]
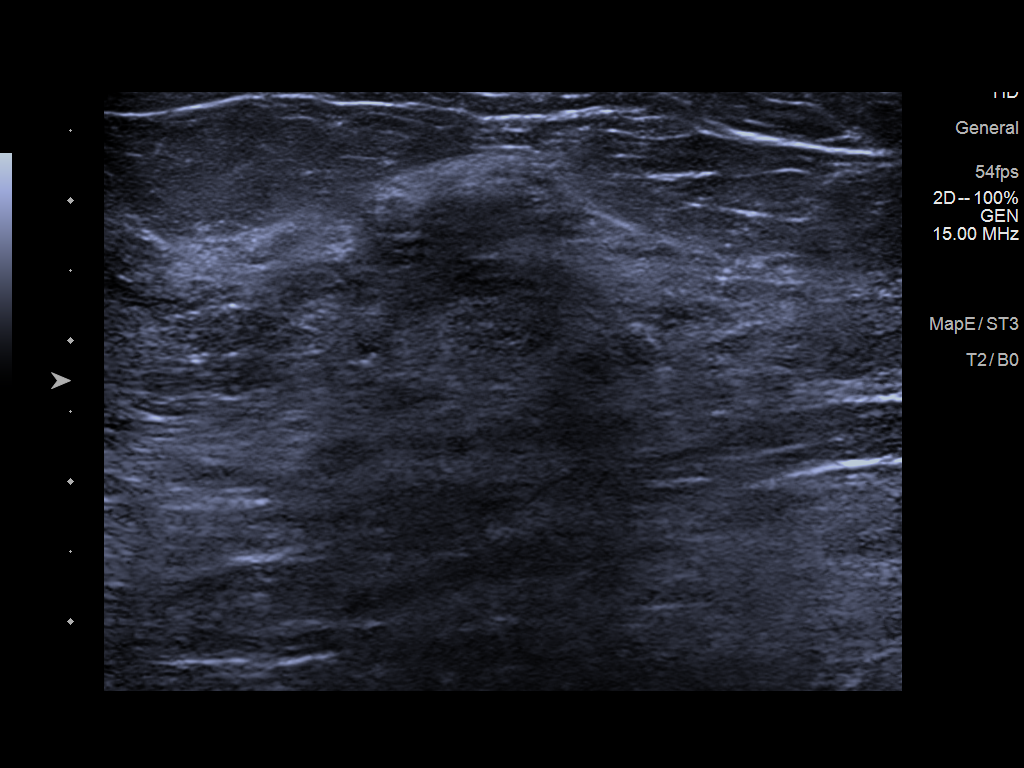
[im 4/6]
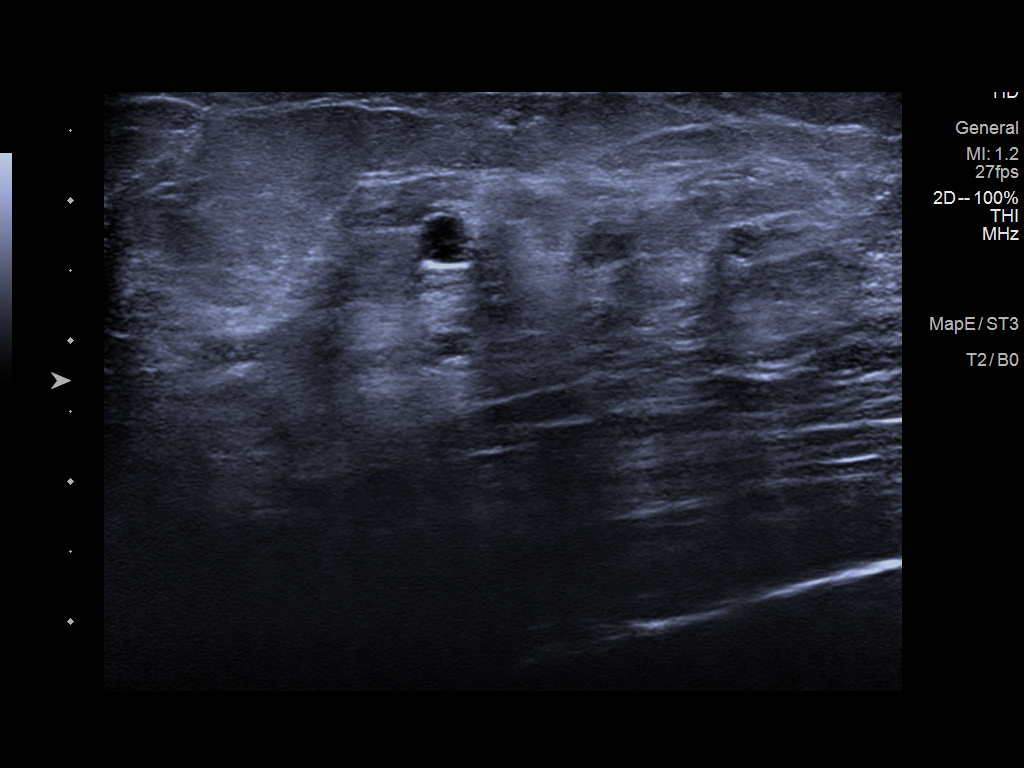
[im 5/6]
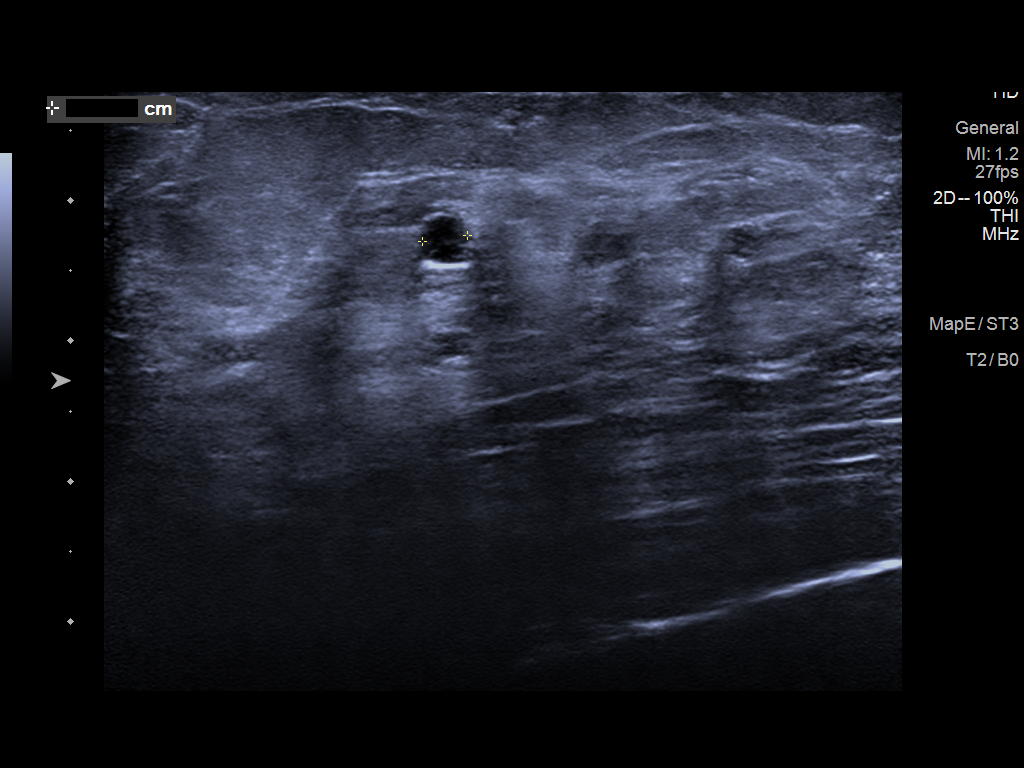
[im 6/6]
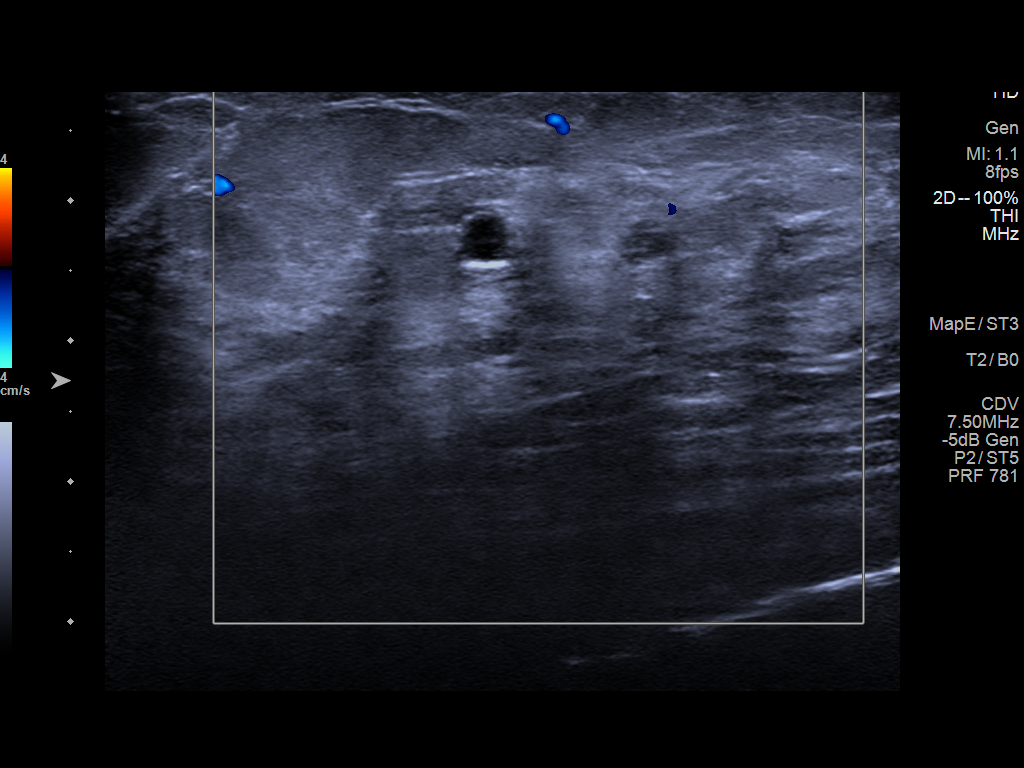

[6 of 6 positions shown; findings below may reference images not displayed]

Baseline

ACR Breast Density Category d: The breast tissue is extremely dense,
which lowers the sensitivity of mammography.
FINDINGS: Spot compression tomosynthesis views were obtained over the palpable
area of concern in the RIGHT breast. No suspicious mammographic
finding is identified in this area. No suspicious mass,
microcalcification, or other finding is identified in either breast.

On physical exam, there is a firm ridge of tissue at the site of
palpable concern.

Targeted RIGHT breast ultrasound was performed in the palpable area
of concern at the upper inner breast. No suspicious solid or cystic
mass is identified. There is a prominent ridge of dense
fibroglandular tissue noted at the site of palpable concern. During
real-time examination, multiple scattered benign cysts were
incidentally noted with representative 3 mm simple cyst documented
at 2 o'clock 5 cm from the nipple.
IMPRESSION: 1. No mammographic or sonographic evidence of malignancy at the site
of palpable concern. There is incidental note of scattered benign
subcentimeter cysts. Any further workup of the patient's symptoms
should be based on the clinical assessment. Recommend routine annual
screening mammogram in 1 year.
2. No mammographic evidence of malignancy bilaterally.

RECOMMENDATION:
Screening mammogram in one year.(Code:[id number]-M-TT5)

The American Cancer Society recommends annual MRI and mammography in
patients with an estimated lifetime risk of developing breast cancer
greater than 20 - 25%, or who are known or suspected to be positive
for the breast cancer gene.

I have discussed the findings and recommendations with the patient.
If applicable, a reminder letter will be sent to the patient
regarding the next appointment.

BI-RADS CATEGORY  2: Benign.

## 2022-10-13 ENCOUNTER — Ambulatory Visit: Payer: BC Managed Care – PPO | Admitting: Family Medicine

## 2022-11-28 ENCOUNTER — Encounter: Payer: Self-pay | Admitting: Family Medicine

## 2022-11-30 ENCOUNTER — Ambulatory Visit: Payer: Self-pay

## 2022-11-30 NOTE — Telephone Encounter (Signed)
     Chief Complaint: Pt. Had procedure Tuesday at [facility] and post procedure bP 160/115. No symptoms Symptoms: Above Frequency: Tuesday Pertinent Negatives: Patient denies any symptoms Disposition: '[]'$ ED /'[]'$ Urgent Care (no appt availability in office) / '[x]'$ Appointment(In office/virtual)/ '[]'$  [facility]/ '[]'$ Home Care/ '[]'$ Refused Recommended Disposition /'[]'$ [facility]/ '[]'$  Follow-up with PCP Additional Notes: Go to ED for worsening of symptoms.  Reason for Disposition  Systolic BP  >= 270 OR Diastolic >= 786  Answer Assessment - Initial Assessment Questions 1. BLOOD PRESSURE: "What is the blood pressure?" "Did you take at least two measurements 5 minutes apart?"     160/115 2. ONSET: "When did you take your blood pressure?"     Tuesday 3. HOW: "How did you take your blood pressure?" (e.g., automatic home BP monitor, visiting nurse)     Hospital 4. HISTORY: "Do you have a history of high blood pressure?"     No 5. MEDICINES: "Are you taking any medicines for blood pressure?" "Have you missed any doses recently?"     No 6. OTHER SYMPTOMS: "Do you have any symptoms?" (e.g., blurred vision, chest pain, difficulty breathing, headache, weakness)     No 7. PREGNANCY: "Is there any chance you are pregnant?" "When was your last menstrual period?"     No  Protocols used: Blood Pressure - High-A-AH

## 2022-12-01 ENCOUNTER — Encounter: Payer: Self-pay | Admitting: Family Medicine

## 2022-12-01 ENCOUNTER — Ambulatory Visit (INDEPENDENT_AMBULATORY_CARE_PROVIDER_SITE_OTHER): Payer: BC Managed Care – PPO | Admitting: Family Medicine

## 2022-12-01 VITALS — BP 160/100 | HR 69 | Ht 64.0 in | Wt 266.2 lb

## 2022-12-01 DIAGNOSIS — F411 Generalized anxiety disorder: Secondary | ICD-10-CM

## 2022-12-01 DIAGNOSIS — D259 Leiomyoma of uterus, unspecified: Secondary | ICD-10-CM | POA: Diagnosis not present

## 2022-12-01 DIAGNOSIS — G8918 Other acute postprocedural pain: Secondary | ICD-10-CM | POA: Diagnosis not present

## 2022-12-01 DIAGNOSIS — R030 Elevated blood-pressure reading, without diagnosis of hypertension: Secondary | ICD-10-CM | POA: Diagnosis not present

## 2022-12-01 MED ORDER — HYDROCODONE-ACETAMINOPHEN 5-325 MG PO TABS: 1.0000 | ORAL_TABLET | ORAL | 0 refills | Status: DC | PRN

## 2022-12-01 NOTE — Patient Instructions (Addendum)
Thank you for coming to the office today.  Stop Oxycodone/Percoet Start the Hydrocodone as needed for pain. Let me know if issue at pharmacy  If possible, may need to borrow or purchase a BP Cuff. Check some readings, Goal < 135/85  Stop the Phentermine as this is likely causing the rise in BP.  Try 1 week sample Contrave - Week 1: 1 pill daily with meal - Week 2: 1 pill twice a day with meal - Week 3: 2 pill with meal in AM and 1 pill with PM meal - Week 4: 2 pill twice a day with meal  Call insurance find cost and coverage of the following - check the following: - Drug Tier, Preferred List, On Formulary - All will require a "Prior Authorization" from [place] first, before you can find out the cost - Find out if there is "Step Therapy" (other medicines required before you can try these)  Once you pick the one you want to try, let me know - we can get a sample ready IF we have it in stock. Then try it - and before running out of medicine, contact me back to order your Rx so we have time to get it processed.  For Weight Loss / Obesity only  Zepbound (weekly shot, same as Darcel Bayley)  Wegovy (same as Ozempic) weekly injection - start 0.49m weekly, 1 dose per pen, single use, auto-injector   Saxenda - DAILY injection - start 0.670minjection DAILY, you can increase the dose by 1 notch or 0.6 mg per week, if you don't tolerate a dose, can reduce it the next day.  Contrave - oral medication, appetite suppression has wellbutrin/bupropion and naltrexone in it and it can also help with appetite, it is ordered through a speciality pharmacy. - Jodean Lima $99 per month, let me know and I can order it   WEIGHT MANAGEMENT  Dr CaDennard [facility] [address] [id number]: [phone] Please schedule a Follow-up Appointment to: Return for keep apt in June 2024.  If you have any other questions or concerns, please feel free to call the  office or send a message through My[facility]osYou may also schedule an earlier appointment if necessary.  Additionally, you may be receiving a survey about your experience at our office within a few days to 1 week by e-mail or mail. We value your feedback.  AlNobie PutnamDO [place] [place]

## 2022-12-01 NOTE — Progress Notes (Signed)
Subjective:    Patient ID: Elizabeth Schultz, female    DOB: 1976-03-07, 47 y.o.   MRN: [id number]  Elizabeth Schultz is a 47 y.o. female presenting on 12/01/2022 for Hypertension   HPI  Elevated BP without HYPERTENSION Morbid OBesity BMI >45 Anxiety  Recent updates - 2/13 IR Embolization of Uterine Fibroids  Oxycodone/Percocet - Pain meds post op, caused itching generalized side effect, she stopped taking, due to side effect. Now in pain more.  She admits BP elevates acutely, before needle or injection or procedure, she will have intense response and will raise her BP. But then if calm can improve BP  Never on med for BP.  Last BP 160/115 post op immediately, also was high pre op  [facility] also [facility]/12 day before her procedure, she started Phentermine 76m daily now for wt loss. Was told it could raise BP. Still taking med.  BP trend 10/02/22 - 128/84 was normal.  Background history - Emergency Surgery May 2023. Has had high BP when goes to doctor  Mesenteric mass s/p excision Cholelithiasis s/p cholecystectomy  Small bowel resection History of EOE, has had upper endoscopy      Anxiety She is improved on current meds but feels Buspar is not lasting during the day, she takes 571mBID currently.   Taking Buspar 49m46mHREE TIMES A DAY AS NEEDED not even once or twice a week, but when needs to take it, she will use it and it is helpful, but may not last very long. - Also on Escitalopram 69m64mily for mood and anxiety. - Admits improvement and she can control her mood overall.       12/01/2022   10:09 AM 10/02/2022    8:32 AM 05/11/2022    9:04 AM  Depression screen PHQ 2/9  Decreased Interest 1 1 0  Down, Depressed, Hopeless 2 1 1  $ PHQ - 2 Score 3 2 1  $ Altered sleeping 3 2 2  $ Tired, decreased energy 3 2 2  $ Change in appetite 3 2 1  $ Feeling bad or failure about yourself  0 1 2  Trouble concentrating 1 0 0  Moving slowly or  fidgety/restless 0 0 0  Suicidal thoughts 0 0 0  PHQ-9 Score 13 9 8  $ Difficult doing work/chores Not difficult at all Not difficult at all Not difficult at all    Social History   Tobacco Use   Smoking status: Never   Smokeless tobacco: Never  Substance Use Topics   Alcohol use: Not Currently   Drug use: Never    Review of Systems Per HPI unless specifically indicated above     Objective:    BP (!) 160/100 (BP Location: Left Arm, Cuff Size: Normal)   Pulse 69   Ht 5' 4"$  (1.626 m)   Wt 266 lb 3.2 oz (120.7 kg)   SpO2 98%   BMI 45.69 kg/m   Wt Readings from Last 3 Encounters:  12/01/22 266 lb 3.2 oz (120.7 kg)  10/02/22 247 lb (112 kg)  05/11/22 229 lb 12.8 oz (104.2 kg)    Physical Exam Vitals and nursing note reviewed.  Constitutional:      General: She is not in acute distress.    Appearance: Normal appearance. She is well-developed. She is obese. She is not diaphoretic.     Comments: Well-appearing, comfortable, cooperative  HENT:     Head: Normocephalic and atraumatic.  Eyes:  General:        Right eye: No discharge.        Left eye: No discharge.     Conjunctiva/sclera: Conjunctivae normal.  Neck:     Thyroid: No thyromegaly.  Cardiovascular:     Rate and Rhythm: Normal rate and regular rhythm.     Heart sounds: Normal heart sounds. No murmur heard. Pulmonary:     Effort: Pulmonary effort is normal. No respiratory distress.     Breath sounds: Normal breath sounds. No wheezing or rales.  Musculoskeletal:        General: Normal range of motion.     Cervical back: Normal range of motion and neck supple.  Lymphadenopathy:     Cervical: No cervical adenopathy.  Skin:    General: Skin is warm and dry.     Findings: No erythema or rash.  Neurological:     Mental Status: She is alert and oriented to person, place, and time.  Psychiatric:        Mood and Affect: Mood normal.        Behavior: Behavior normal.        Thought Content: Thought content  normal.     Comments: Well groomed, good eye contact, normal speech and thoughts       Results for orders placed or performed in visit on 05/11/22  CBC with Differential/Platelet  Result Value Ref Range   WBC 7.2 3.8 - 10.8 Thousand/uL   RBC 4.51 3.80 - 5.10 Million/uL   Hemoglobin 12.1 11.7 - 15.5 g/dL   HCT 38.6 35.0 - 45.0 %   MCV 85.6 80.0 - 100.0 fL   MCH 26.8 (L) 27.0 - 33.0 pg   MCHC 31.3 (L) 32.0 - 36.0 g/dL   RDW 17.0 (H) 11.0 - 15.0 %   Platelets 339 140 - 400 Thousand/uL   MPV 9.8 7.5 - 12.5 fL   Neutro Abs 4,442 1,500 - 7,800 cells/uL   Lymphs Abs 1,980 850 - 3,900 cells/uL   Absolute Monocytes 547 200 - 950 cells/uL   Eosinophils Absolute 180 15 - 500 cells/uL   Basophils Absolute 50 0 - 200 cells/uL   Neutrophils Relative % 61.7 %   Total Lymphocyte 27.5 %   Monocytes Relative 7.6 %   Eosinophils Relative 2.5 %   Basophils Relative 0.7 %      Assessment & Plan:   Problem List Items Addressed This Visit     GAD (generalized anxiety disorder)   Morbid obesity (HCC)   Relevant Medications   phentermine 15 MG capsule   Other Visit Diagnoses     Elevated BP without diagnosis of hypertension    -  Primary   Uterine leiomyoma, unspecified location       Relevant Medications   HYDROcodone-acetaminophen (NORCO/VICODIN) 5-325 MG tablet   Post-operative pain       Relevant Medications   HYDROcodone-acetaminophen (NORCO/VICODIN) 5-325 MG tablet       Elevated BP acutely Secondary to Phentermine med side effect most likely  Stop Oxycodone/Percoet Start the Hydrocodone as needed for pain.  If possible, may need to borrow or purchase a BP Cuff. Check some readings, Goal < 135/85  Stop the Phentermine as this is likely causing the rise in BP.  Defer start of any BP medication at this time. We can consider beta blocker if need in future either AS NEEDED use if elevation due to anxiety or maybe low dose but prefer to avoid long term med for now.  Weight  management Already pursuing lifestyle diet and exercise regimen currently. Limited success Tried Saxenda, Phentermine failed due to side effect, hypertension  Try 1 week sample Contrave - Week 1: 1 pill daily with meal - Week 2: 1 pill twice a day with meal - Week 3: 2 pill with meal in AM and 1 pill with PM meal - Week 4: 2 pill twice a day with meal  She will notify me after 1 week, and if doing well on Contrave, I can order to [place] $99 per month plan with this med.  She can check ins for GLP1 therapy, it seems [facility] is only other option if we resume.  Meds ordered this encounter  Medications   HYDROcodone-acetaminophen (NORCO/VICODIN) 5-325 MG tablet    Sig: Take 1 tablet by mouth every 4 (four) hours as needed for moderate pain.    Dispense:  30 tablet    Refill:  0    Patient has itching allergy to Oxycodone, cannot take that anymore. Needs to switch      Follow up plan: Return for keep apt in June 2024.   [name], DO [facility] 12/01/2022, 10:40 AM

## 2022-12-05 ENCOUNTER — Encounter: Payer: Self-pay | Admitting: Family Medicine

## 2022-12-05 MED ORDER — CONTRAVE 8-90 MG PO TB12: ORAL_TABLET | ORAL | 0 refills | Status: DC

## 2022-12-25 ENCOUNTER — Encounter: Payer: Self-pay | Admitting: Family Medicine

## 2022-12-25 DIAGNOSIS — I10 Essential (primary) hypertension: Secondary | ICD-10-CM | POA: Insufficient documentation

## 2022-12-25 MED ORDER — AMLODIPINE BESYLATE 10 MG PO TABS: ORAL_TABLET | ORAL | 0 refills | Status: DC

## 2022-12-27 ENCOUNTER — Ambulatory Visit: Payer: Self-pay

## 2022-12-27 NOTE — Telephone Encounter (Signed)
  Chief Complaint: HTN Symptoms: BP 169/126, 156/114, 159/118, L arm numbness Frequency: BP elevated x 3 days, numbness started today Pertinent Negatives: Patient denies dizziness, chest pain, HA Disposition: [x] ED /[] Urgent Care (no appt availability in office) / [] Appointment(In office/virtual)/ []  [place] [facility]/ [] Home Care/ [] Refused Recommended Disposition /[] [place] [facility]/ []  Follow-up with PCP Additional Notes: pt states she has been checking BP x 3 days and BP is higher now than when she started amlodipine. Advised pt to go to ED for eval d/t new L arm numbness. Pt verbalized understanding.   Reason for Disposition  [1] Systolic BP  >= 962 OR Diastolic >= 229 AND [7] cardiac (e.g., breathing difficulty, chest pain) or neurologic symptoms (e.g., new-onset blurred or double vision, unsteady gait)  Answer Assessment - Initial Assessment Questions 1. BLOOD PRESSURE: "What is the blood pressure?" "Did you take at least two measurements 5 minutes apart?"     169/126 156/114 2. ONSET: "When did you take your blood pressure?"     Today  3. HOW: "How did you take your blood pressure?" (e.g., automatic home BP monitor, visiting nurse)     Home  4. HISTORY: "Do you have a history of high blood pressure?"     yes 5. MEDICINES: "Are you taking any medicines for blood pressure?" "Have you missed any doses recently?"     Yes and no 6. OTHER SYMPTOMS: "Do you have any symptoms?" (e.g., blurred vision, chest pain, difficulty breathing, headache, weakness)     L arm numbness from shoulder to fingertips  Protocols used: Blood Pressure - High-A-AH

## 2022-12-28 ENCOUNTER — Telehealth: Payer: Self-pay

## 2022-12-28 NOTE — Transitions of Care (Post Inpatient/ED Visit) (Signed)
   12/28/2022  Name: GISSELL BARRA MRN: [id number] DOB: [birth year]  Today's TOC FU Call Status: TOC FU Call Complete Date: 12/28/22  Transition Care Management Follow-up Telephone Call Date of Discharge: 12/28/22 Discharge Facility: Other ([place]) Name of Other (Non[facility]) Discharge Facility: [facility] Type of Discharge: Emergency Department Reason for ED Visit: Other: (HTN) How have you been since you were released from the hospital?: Better Any questions or concerns?: Yes Patient Questions/Concerns:: BP reading today Left Arm: 142/91 Right Arm: 155/106 Patient Questions/Concerns Addressed: Notified Provider of Patient Questions/Concerns  Items Reviewed: Did you receive and understand the discharge instructions provided?: Yes Medications obtained and verified?: Yes (Medications Reviewed) Any new allergies since your discharge?: No Dietary orders reviewed?: NA Do you have support at home?: Yes  Home Care and Equipment/Supplies: [place] Ordered?: NA Any new equipment or medical supplies ordered?: NA  Functional Questionnaire: Do you need assistance with bathing/showering or dressing?: No Do you need assistance with meal preparation?: No Do you need assistance with eating?: No Do you have difficulty maintaining continence: No Do you need assistance with getting out of bed/getting out of a chair/moving?: No Do you have difficulty managing or taking your medications?: No  Folllow up appointments reviewed: PCP Follow-up appointment confirmed?: Yes Date of PCP follow-up appointment?: 01/01/23 Follow-up Provider: Dr. Vonita Moss Specialist [facility] Follow-up appointment confirmed?: NA Do you need transportation to your follow-up appointment?: No Do you understand care options if your condition(s) worsen?: Yes-patient verbalized understanding  Dione Booze, [facility]) Carlyon Prows [phone]

## 2023-01-01 ENCOUNTER — Ambulatory Visit: Payer: BC Managed Care – PPO | Admitting: Family Medicine

## 2023-01-01 ENCOUNTER — Encounter: Payer: Self-pay | Admitting: Family Medicine

## 2023-01-01 VITALS — BP 140/98 | HR 81 | Ht 64.0 in | Wt 263.0 lb

## 2023-01-01 DIAGNOSIS — I10 Essential (primary) hypertension: Secondary | ICD-10-CM | POA: Diagnosis not present

## 2023-01-01 NOTE — Patient Instructions (Addendum)
Thank you for coming to the office today.  Taper off Contrave, go to 1 pill twice a day for 3 days then stop completely  Keep track of BP  If off Contrave BP returns to normal < 140 / 90, may be able to wean off of the Amlodipine  If off Contrave BP stays 140/90+ range, then we would suggest increase Amlodipine from half to whole 1 tab = 10mg  daily  ----------------------------------  [facility] Address: [address], [place], [place] [address] Phone: [phone]  Referral sent via fax today. Stay tuned to hear back from them, they should call you to schedule initial sleep study, likely a home test and then if abnormal - they will arrange the 2nd part of the sleep study in the lab with the CPAP machine.  If there is any issue with this company, for example not covered by insurance or other problem, please notify us and they may do so a well - we will need to change the location of the referral.  Let me know within 30 days if you want to pursue sleep study   Please schedule a Follow-up Appointment to: Return if symptoms worsen or fail to improve.  If you have any other questions or concerns, please feel free to call the office or send a message through [place]. You may also schedule an earlier appointment if necessary.  Additionally, you may be receiving a survey about your experience at our office within a few days to 1 week by e-mail or mail. We value your feedback.  [name], DO [place]

## 2023-01-01 NOTE — Progress Notes (Signed)
Subjective:    Patient ID: Elizabeth Schultz, female    DOB: 1976-08-28, 47 y.o.   MRN: [id number]  Elizabeth Schultz is a 47 y.o. female presenting on 01/01/2023 for Hospitalization Follow-up   HPI  Hypertension Morbid obesity  Interval update, last visit with me 12/01/22 - she was still having high BP readings since earlier February with IR Embolization of uterine fibroids, has had BP 160/100+, no prior medication for HYPERTENSION.  Initially thought BP was elevated due to Phentermine then that was discontinued.  Recently 12/25/22 - highest peak BP 188/108, she was started on Amodipine half of the 10mg  = 5mg  daily  Still taking Contrave  Update with phone call on 12/27/22 - up to 169/126 - L arm numbness, she went to [facility] ED, BP 170/99, 161/111, 131/96. 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 She has been off of this for months now 6 months and still has hot flashes. She will ask her GYN next.  Additionally she has difficulty sleeping, snoring and will wake up, and has hot flashes. See below concern for sleep apnea.   Epworth Sleepiness Scale Total Score: 12 Sitting and reading - 3 Watching TV - 3 Sitting inactive in a public place - 2 As a passenger in a car for an hour without a break - 3 Lying down to rest in the afternoon when circumstances permit - 1 Sitting and talking to someone - 0 Sitting quietly after a lunch without alcohol - 0 In a car, while stopped for a few minutes in traffic - 0  STOP-Bang OSA scoring Snoring yes   Tiredness yes   Observed apneas no   Pressure HTN yes   BMI > 35 kg/m2 yes   Age > 41  no   Neck (female >17 in; Female >16 in)  yes   Gender female no   OSA risk low  (0-2)  OSA risk intermediate (3-4)  OSA risk high (5+)  Total: 5 high risk        01/01/2023    9:00 AM 12/01/2022   10:09 AM 10/02/2022    8:32 AM  Depression screen PHQ 2/9  Decreased Interest 0 1 1  Down, Depressed, Hopeless 1 2 1   PHQ - 2 Score 1 3 2   Altered sleeping 3 3 2   Tired, decreased energy 3 3 2   Change in appetite 3 3 2   Feeling bad or failure about yourself  2 0 1  Trouble concentrating 0 1 0  Moving slowly or fidgety/restless 0 0 0  Suicidal thoughts 0 0 0  PHQ-9 Score 12 13 9   Difficult doing work/chores Not difficult at all Not difficult at all Not difficult at all    Social History   Tobacco Use   Smoking status: Never   Smokeless tobacco: Never  Substance Use Topics   Alcohol use: Not Currently   Drug use: Never    Review of Systems Per HPI unless specifically indicated above     Objective:    BP (!) 140/98 (BP Location: Left Arm, Cuff Size: Normal)   Pulse 81   Ht 5\' 4"  (1.626 m)   Wt 263 lb (119.3 kg)   SpO2 97%   BMI 45.14 kg/m  Wt Readings from Last 3 Encounters:  01/01/23 263 lb (119.3 kg)  12/01/22 266 lb 3.2 oz (120.7 kg)  10/02/22 247 lb (112 kg)    Physical Exam Vitals and nursing note reviewed.  Constitutional:      General: She is not in acute distress.    Appearance: She is well-developed. She is obese. She is not diaphoretic.     Comments: Well-appearing, comfortable, cooperative  HENT:     Head: Normocephalic and atraumatic.  Eyes:     General:        Right eye: No discharge.        Left eye: No discharge.     Conjunctiva/sclera: Conjunctivae normal.  Neck:     Thyroid: No thyromegaly.  Cardiovascular:     Rate and Rhythm: Normal rate and regular rhythm.     Heart sounds: Normal heart sounds. No murmur heard. Pulmonary:     Effort: Pulmonary effort is normal. No respiratory distress.     Breath sounds: Normal breath sounds. No wheezing or rales.  Musculoskeletal:        General: Normal range of motion.      Cervical back: Normal range of motion and neck supple.  Lymphadenopathy:     Cervical: No cervical adenopathy.  Skin:    General: Skin is warm and dry.     Findings: No erythema or rash.  Neurological:     Mental Status: She is alert and oriented to person, place, and time.  Psychiatric:        Behavior: Behavior normal.     Comments: Well groomed, good eye contact, normal speech and thoughts     I have personally reviewed the radiology report from 12/27/22 .  12/27/2022 7:17 PM EDT  EXAM: XR CHEST 2 VIEWS ACCESSION: [id number] [facility]  CLINICAL INDICATION: CHEST PAIN ; Chest Pain    TECHNIQUE: PA and Lateral Chest Radiographs.  COMPARISON: 03/11/2022  FINDINGS:  Lungs are clear.  No pleural effusion or pneumothorax.  Normal heart size and mediastinal contours.     Imaging Results - XR Chest 2 views (12/27/2022 6:56 PM EDT) Procedure Note  Terrilyn Saver, MD - 12/27/2022  Formatting of this note might be different from the original. EXAM: XR CHEST 2 VIEWS ACCESSION: [id number] [facility]  CLINICAL INDICATION: CHEST PAIN ; Chest Pain  TECHNIQUE: PA and Lateral Chest Radiographs.  COMPARISON: 03/11/2022  FINDINGS:  Lungs are clear. No pleural effusion or pneumothorax.  Normal heart size and mediastinal contours.  IMPRESSION:  No acute cardiopulmonary abnormality.   Imaging Results - XR Chest 2 views (12/27/2022 6:56 PM EDT) Authorizing Provider Result Type  Althea Grimmer MD I    Results for orders placed or performed in visit on 05/11/22  CBC with Differential/Platelet  Result Value Ref Range   WBC 7.2 3.8 - 10.8 Thousand/uL   RBC 4.51 3.80 - 5.10 Million/uL   Hemoglobin 12.1 11.7 - 15.5 g/dL   HCT 38.6 35.0 - 45.0 %   MCV 85.6 80.0 - 100.0 fL   MCH 26.8 (L) 27.0 - 33.0 pg   MCHC 31.3 (L) 32.0 - 36.0 g/dL   RDW 17.0 (H) 11.0 - 15.0 %   Platelets 339 140 - 400 Thousand/uL   MPV 9.8 7.5 - 12.5 fL   Neutro Abs 4,442 1,500 - 7,800 cells/uL    Lymphs Abs 1,980 850 - 3,900 cells/uL   Absolute Monocytes 547 200 - 950 cells/uL   Eosinophils Absolute 180 15 - 500 cells/uL   Basophils  Absolute 50 0 - 200 cells/uL   Neutrophils Relative % 61.7 %   Total Lymphocyte 27.5 %   Monocytes Relative 7.6 %   Eosinophils Relative 2.5 %   Basophils Relative 0.7 %      Assessment & Plan:   Problem List Items Addressed This Visit     Essential hypertension - Primary   Morbid obesity ([place])     Still dealing with HYPERTENSION concern, question etiology.  Keep on Amlodipine 5mg  daily (half of 10mg ) for now  Check with GYN to discuss hormonal component from Lupron injection back in October with fibroids  Considered medication side effect Phentermine but she remains OFF this now for past 1 month. Next we discussed today may have side effect from Contrave (5-6% listed HYPERTENSION side effect)  Taper off Contrave, go to 1 pill twice a day for 3 days then stop completely  Keep track of BP  If off Contrave BP returns to normal < 140 / 90, may be able to wean off of the Amlodipine  If off Contrave BP stays 140/90+ range, then we would suggest increase Amlodipine from half to whole 1 tab = 10mg  daily  ----------------------------------  Offer sleep study if interested to pursue testing. Info given per AVS  [facility] Address: [address], [place], [place] [address] Phone: [phone]   No orders of the defined types were placed in this encounter.    Follow up plan: Return if symptoms worsen or fail to improve.   [name], [facility] 01/01/2023, 8:57 AM

## 2023-01-16 ENCOUNTER — Other Ambulatory Visit: Payer: Self-pay | Admitting: Family Medicine

## 2023-01-16 DIAGNOSIS — I10 Essential (primary) hypertension: Secondary | ICD-10-CM

## 2023-01-16 NOTE — Telephone Encounter (Signed)
Requested medication (s) are due for refill today: yes  Requested medication (s) are on the active medication list: yes  Last refill:  12/25/22  Future visit scheduled: yes  Notes to clinic:  Unable to refill per protocol due to diagnosis code needed.      Requested Prescriptions  Pending Prescriptions Disp Refills   amLODipine (NORVASC) 10 MG tablet [Pharmacy Med Name: AMLODIPINE BESYLATE 10 MG TAB] 90 tablet 1    Sig: Start taking half tablet 5mg  dose daily for blood pressure. If elevated BP still after 1-2 weeks, you may increase dose to 1 whole tablet 10mg  daily.     Cardiovascular: Calcium Channel Blockers 2 Failed - 01/16/2023  1:34 PM      Failed - Last BP in normal range    BP Readings from Last 1 Encounters:  01/01/23 (!) 140/98         Passed - Last Heart Rate in normal range    Pulse Readings from Last 1 Encounters:  01/01/23 81         Passed - Valid encounter within last 6 months    Recent Outpatient Visits           2 weeks ago Essential hypertension   [place], DO   1 month ago Elevated BP without diagnosis of hypertension   [place], DO   3 months ago GAD (generalized anxiety disorder)   [place], DO   8 months ago GAD (generalized anxiety disorder)   [place], DO   1 year ago Morbid obesity [facility])   [facility] Parks Ranger, Devonne Doughty, DO       Future Appointments             In 2 months Parks Ranger, Devonne Doughty, DO [facility], [facility]

## 2023-01-18 ENCOUNTER — Other Ambulatory Visit: Payer: Self-pay

## 2023-01-30 ENCOUNTER — Encounter (INDEPENDENT_AMBULATORY_CARE_PROVIDER_SITE_OTHER): Payer: BC Managed Care – PPO | Admitting: Family Medicine

## 2023-01-30 DIAGNOSIS — I10 Essential (primary) hypertension: Secondary | ICD-10-CM | POA: Diagnosis not present

## 2023-01-31 MED ORDER — VALSARTAN 80 MG PO TABS: 80.0000 mg | ORAL_TABLET | Freq: Every day | ORAL | 3 refills | Status: DC

## 2023-01-31 NOTE — Telephone Encounter (Signed)
Please see the MyChart message reply(ies) for my assessment and plan.    This patient gave consent for this Medical Advice Message and is aware that it may result in a bill to their insurance company, as well as the possibility of receiving a bill for a co-payment or deductible. They are an established patient, but are not seeking medical advice exclusively about a problem treated during an in person or video visit in the last seven days. I did not recommend an in person or video visit within seven days of my reply.    I spent a total of 10 minutes cumulative time within 7 days through MyChart messaging.  Aspen Lawrance, DO   

## 2023-01-31 NOTE — Addendum Note (Signed)
Addended by: Smitty Cords on: 01/31/2023 05:50 PM   Modules accepted: Orders

## 2023-02-15 ENCOUNTER — Other Ambulatory Visit: Payer: BC Managed Care – PPO

## 2023-02-15 DIAGNOSIS — I10 Essential (primary) hypertension: Secondary | ICD-10-CM

## 2023-02-16 LAB — BASIC METABOLIC PANEL WITH GFR
BUN: 8 mg/dL (ref 7–25)
CO2: 29 mmol/L (ref 20–32)
Calcium: 9 mg/dL (ref 8.6–10.2)
Chloride: 103 mmol/L (ref 98–110)
Creat: 0.66 mg/dL (ref 0.50–0.99)
Glucose, Bld: 94 mg/dL (ref 65–99)
Potassium: 3.5 mmol/L (ref 3.5–5.3)
Sodium: 140 mmol/L (ref 135–146)
eGFR: 111 mL/min/{1.73_m2} (ref >=60)

## 2023-02-19 ENCOUNTER — Encounter: Payer: Self-pay | Admitting: Family Medicine

## 2023-02-19 DIAGNOSIS — G4733 Obstructive sleep apnea (adult) (pediatric): Secondary | ICD-10-CM | POA: Insufficient documentation

## 2023-03-13 ENCOUNTER — Encounter: Payer: Self-pay | Admitting: Family Medicine

## 2023-04-04 ENCOUNTER — Ambulatory Visit (INDEPENDENT_AMBULATORY_CARE_PROVIDER_SITE_OTHER): Payer: BC Managed Care – PPO | Admitting: Family Medicine

## 2023-04-04 ENCOUNTER — Encounter: Payer: Self-pay | Admitting: Family Medicine

## 2023-04-04 VITALS — BP 110/78 | HR 71 | Ht 64.0 in | Wt 281.0 lb

## 2023-04-04 DIAGNOSIS — I10 Essential (primary) hypertension: Secondary | ICD-10-CM

## 2023-04-04 DIAGNOSIS — F411 Generalized anxiety disorder: Secondary | ICD-10-CM

## 2023-04-04 DIAGNOSIS — F331 Major depressive disorder, recurrent, moderate: Secondary | ICD-10-CM

## 2023-04-04 DIAGNOSIS — Z0000 Encounter for general adult medical examination without abnormal findings: Secondary | ICD-10-CM

## 2023-04-04 DIAGNOSIS — D509 Iron deficiency anemia, unspecified: Secondary | ICD-10-CM

## 2023-04-04 DIAGNOSIS — G4733 Obstructive sleep apnea (adult) (pediatric): Secondary | ICD-10-CM

## 2023-04-04 LAB — CBC WITH DIFFERENTIAL/PLATELET
Eosinophils Absolute: 270 cells/uL (ref 15–500)
MCH: 27.6 pg (ref 27.0–33.0)
MCV: 88 fL (ref 80.0–100.0)
Monocytes Relative: 5.2 %
Neutrophils Relative %: 71.4 %
Total Lymphocyte: 20.1 %

## 2023-04-04 LAB — LIPID PANEL
Cholesterol: 149 mg/dL (ref <200)
Total CHOL/HDL Ratio: 3.5 (calc) (ref <5.0)

## 2023-04-04 LAB — IRON,TIBC AND FERRITIN PANEL: Iron: 48 ug/dL (ref 40–190)

## 2023-04-04 MED ORDER — BUSPIRONE HCL 5 MG PO TABS: 5.0000 mg | ORAL_TABLET | Freq: Three times a day (TID) | ORAL | 3 refills | Status: DC

## 2023-04-04 MED ORDER — AMLODIPINE BESYLATE 5 MG PO TABS
5.0000 mg | ORAL_TABLET | Freq: Every day | ORAL | 3 refills | Status: DC
Start: 2023-04-04 — End: 2024-04-03

## 2023-04-04 NOTE — Progress Notes (Unsigned)
Subjective:    Patient ID: Elizabeth Schultz, female    DOB: 1976/09/16, 47 y.o.   MRN: [id number]  Elizabeth Schultz is a 47 y.o. female presenting on 04/04/2023 for Annual Exam   HPI  Hypertension   Interval update, last visit with me 12/01/22 - she was still having high BP readings since earlier February with IR Embolization of uterine fibroids, has had BP 160/100+, no prior medication for HYPERTENSION. Initially thought BP was elevated due to Phentermine then that was discontinued. Previously 12/25/22 - highest peak BP 188/108, she was started on Amodipine half of the 10mg  = 5mg  daily Also on Valsartan 80mg  daily now She had OSA and now has CPAP machine with improvement in BP and sleep  Current meds - Amlodipine 5mg  daily (half of 10) and Valsartan 80mg  daily Reports good compliance, took meds today. Tolerating well, w/o complaints. Denies CP, dyspnea, HA, edema, dizziness / lightheadedness   OSA, on CPAP - Patient reports prior history of dx OSA and new dx OSA and on CPAP New CPAP for past 2+ weeks, she feels much better, once pressure adjusted. Improved with CPAP BP has improved overall  Anxiety On Escitalopram 10mg  daily On Buspar 5mg  twice a day regularly, needs re order.   Health Maintenance:  Due for Pap Smear - will return to [facility] GYN Dr Melvyn Neth  Due for Colon CA Screening age 55+, she will consider the Cologuard and contact us back.     04/04/2023    8:13 AM 01/01/2023    9:00 AM 12/01/2022   10:09 AM  Depression screen PHQ 2/9  Decreased Interest 0 0 1  Down, Depressed, Hopeless 1 1 2   PHQ - 2 Score 1 1 3   Altered sleeping 1 3 3   Tired, decreased energy 3 3 3   Change in appetite 3 3 3   Feeling bad or failure about yourself  3 2 0  Trouble concentrating 0 0 1  Moving slowly or fidgety/restless 0 0 0  Suicidal thoughts 0 0 0  PHQ-9 Score 11 12 13   Difficult doing work/chores  Not difficult at all Not difficult at all      04/04/2023    8:13 AM 01/01/2023     9:00 AM 12/01/2022   10:10 AM 10/02/2022    8:33 AM  GAD 7 : Generalized Anxiety Score  Nervous, Anxious, on Edge 2 3 3 2   Control/stop worrying 1 1 1 2   Worry too much - different things 1 2 2 1   Trouble relaxing 3 3 3 1   Restless 0 0 3 0  Easily annoyed or irritable 0 0 0 1  Afraid - awful might happen 0 0 0 1  Total GAD 7 Score 7 9 12 8   Anxiety Difficulty  Not difficult at all Not difficult at all Not difficult at all      Past Medical History:  Diagnosis Date   Anxiety    Past Surgical History:  Procedure Laterality Date   ANTERIOR CRUCIATE LIGAMENT REPAIR  05/2011   TUBAL LIGATION Bilateral 03/2003   WISDOM TOOTH EXTRACTION Bilateral 11/1995   Social History   Socioeconomic History   Marital status: Married    Spouse name: Not on file   Number of children: Not on file   Years of education: Not on file   Highest education level: Not on file  Occupational History   Not on file  Tobacco Use   Smoking status: Never   Smokeless tobacco: Never  Substance  and Sexual Activity   Alcohol use: Not Currently   Drug use: Never   Sexual activity: Yes  Other Topics Concern   Not on file  Social History Narrative   Not on file   Social Determinants of Health   Financial Resource Strain: Not on file  Food Insecurity: Not on file  Transportation Needs: Not on file  Physical Activity: Not on file  Stress: Not on file  Social Connections: Not on file  Intimate Partner Violence: Not on file   Family History  Problem Relation Age of Onset   Heart disease Mother    Thyroid disease Mother    Bipolar disorder Father    Breast cancer Maternal Grandmother 45   Bipolar disorder Paternal Grandmother    Breast cancer Maternal Aunt 10   Breast cancer Paternal Aunt 10   Current Outpatient Medications on File Prior to Visit  Medication Sig   albuterol (VENTOLIN HFA) 108 (90 Base) MCG/ACT inhaler Inhale 1-2 puffs into the lungs every 6 (six) hours as needed.   escitalopram  (LEXAPRO) 10 MG tablet TAKE 1 TABLET BY MOUTH EVERY DAY   ibuprofen (ADVIL) 600 MG tablet Take by mouth.   omeprazole (PRILOSEC) 20 MG capsule Take 1 capsule (20 mg total) by mouth daily before breakfast.   valsartan (DIOVAN) 80 MG tablet Take 1 tablet (80 mg total) by mouth daily.   No current facility-administered medications on file prior to visit.   Past Surgical History:  Procedure Laterality Date   ANTERIOR CRUCIATE LIGAMENT REPAIR  05/2011   TUBAL LIGATION Bilateral 03/2003   WISDOM TOOTH EXTRACTION Bilateral 11/1995     Review of Systems Per HPI unless specifically indicated above      Objective:    BP 110/78   Pulse 71   Ht 5\' 4"  (1.626 m)   Wt 281 lb (127.5 kg)   LMP 03/28/2023   SpO2 98%   BMI 48.23 kg/m   Wt Readings from Last 3 Encounters:  04/04/23 281 lb (127.5 kg)  01/01/23 263 lb (119.3 kg)  12/01/22 266 lb 3.2 oz (120.7 kg)    Physical Exam   Results for orders placed or performed in visit on 02/15/23  BASIC METABOLIC PANEL WITH GFR  Result Value Ref Range   Glucose, Bld 94 65 - 99 mg/dL   BUN 8 7 - 25 mg/dL   Creat 1.61 0.96 - 0.45 mg/dL   eGFR 409 > OR = 60 WJ/XBJ/4.78G9   BUN/Creatinine Ratio SEE NOTE: 6 - 22 (calc)   Sodium 140 135 - 146 mmol/L   Potassium 3.5 3.5 - 5.3 mmol/L   Chloride 103 98 - 110 mmol/L   CO2 29 20 - 32 mmol/L   Calcium 9.0 8.6 - 10.2 mg/dL      Assessment & Plan:   Problem List Items Addressed This Visit     Essential hypertension   Relevant Medications   amLODipine (NORVASC) 5 MG tablet   Other Relevant Orders   COMPLETE METABOLIC PANEL WITH GFR   CBC with Differential/Platelet   GAD (generalized anxiety disorder)   Relevant Medications   busPIRone (BUSPAR) 5 MG tablet   Morbid obesity (HCC)   Relevant Orders   Lipid panel   TSH   Other Visit Diagnoses     Annual physical exam    -  Primary   Relevant Orders   COMPLETE METABOLIC PANEL WITH GFR   CBC with Differential/Platelet   Hemoglobin A1c    Lipid panel  Iron, TIBC and Ferritin Panel   Major depressive disorder, recurrent, moderate (HCC)       Relevant Medications   busPIRone (BUSPAR) 5 MG tablet   Iron deficiency anemia, unspecified iron deficiency anemia type       Relevant Orders   CBC with Differential/Platelet   Iron, TIBC and Ferritin Panel       Updated Health Maintenance information Encouraged improvement to lifestyle with diet and exercise Goal of weight loss   Meds ordered this encounter  Medications   amLODipine (NORVASC) 5 MG tablet    Sig: Take 1 tablet (5 mg total) by mouth daily.    Dispense:  90 tablet    Refill:  3   busPIRone (BUSPAR) 5 MG tablet    Sig: Take 1 tablet (5 mg total) by mouth 3 (three) times daily.    Dispense:  270 tablet    Refill:  3      Follow up plan: Return in about 6 months (around 10/04/2023) for 6 month follow-up HYPERTENSION, Weight, OSA, Anxiety, updates .  Saralyn Pilar, DO [facility] 04/04/2023, 8:14 AM

## 2023-04-04 NOTE — Patient Instructions (Addendum)
Thank you for coming to the office today.  Refill on Amlodipine 5 mg daily  Keep on CPAP, we can consider reducing dose or discontinuing some BP meds in the near future once you maintain course on CPAP.  Labs today, pending results.  ----------------  Colon Cancer Screening: - For all adults age 45+ routine colon cancer screening is highly recommended.     - Recent guidelines from American [employer] Society recommend starting age of 65 - Early detection of colon cancer is important, because often there are no warning signs or symptoms, also if found early usually it can be cured. Late stage is hard to treat.  - If you are not interested in Colonoscopy screening (if done and normal you could be cleared for 5 to 10 years until next due), then Cologuard is an excellent alternative for screening test for Colon Cancer. It is highly sensitive for detecting DNA of colon cancer from even the earliest stages. Also, there is NO bowel prep required. - If Cologuard is NEGATIVE, then it is good for 3 years before next due - If Cologuard is POSITIVE, then it is strongly advised to get a Colonoscopy, which allows the GI doctor to locate the source of the cancer or polyp (even very early stage) and treat it by removing it. ------------------------- If you would like to proceed with Cologuard (stool DNA test) - FIRST, call your insurance company and tell them you want to check cost of Cologuard tell them CPT Code 16109 (it may be completely covered and you could get for no cost, OR max cost without any coverage is about $600). Also, keep in mind if you do NOT open the kit, and decide not to do the test, you will NOT be charged, you should contact the company if you decide not to do the test. - If you want to proceed, you can notify us (phone message, MyChart Message, or at next visit) and we will order it for you. The test kit will be delivered to you house within about 1 week. Follow instructions to collect  sample, you may call the company for any help or questions, 24/7 telephone support at [phone].  WHEN - Ordered the Cologuard (home kit) test for colon cancer screening. Stay tuned for further updates.  It will be shipped to you directly. If not received in 2-4 weeks, call us or the company.   If you send it back and no results are received in 2-4 weeks, call us or the company as well!   Colon Cancer Screening: - For all adults age 75+ routine colon cancer screening is highly recommended.     - Recent guidelines from American [employer] Society recommend starting age of 17 - Early detection of colon cancer is important, because often there are no warning signs or symptoms, also if found early usually it can be cured. Late stage is hard to treat.   - If Cologuard is NEGATIVE, then it is good for 3 years before next due - If Cologuard is POSITIVE, then it is strongly advised to get a Colonoscopy, which allows the GI doctor to locate the source of the cancer or polyp (even very early stage) and treat it by removing it. ------------------------- Follow instructions to collect sample, you may call the company for any help or questions, 24/7 telephone support at [phone].   Please schedule a Follow-up Appointment to: Return in about 6 months (around 10/04/2023) for 6 month follow-up HYPERTENSION, Weight, OSA, Anxiety, updates .  If you have any other questions or concerns, please feel free to call the office or send a message through [place]. You may also schedule an earlier appointment if necessary.  Additionally, you may be receiving a survey about your experience at our office within a few days to 1 week by e-mail or mail. We value your feedback.  [name], DO [place]

## 2023-04-05 LAB — COMPLETE METABOLIC PANEL WITH GFR
AG Ratio: 1.5 (calc) (ref 1.0–2.5)
ALT: 11 U/L (ref 6–29)
AST: 9 U/L — ABNORMAL LOW (ref 10–30)
Albumin: 4 g/dL (ref 3.6–5.1)
Alkaline phosphatase (APISO): 78 U/L (ref 31–125)
BUN: 11 mg/dL (ref 7–25)
CO2: 29 mmol/L (ref 20–32)
Calcium: 8.4 mg/dL — ABNORMAL LOW (ref 8.6–10.2)
Chloride: 103 mmol/L (ref 98–110)
Creat: 0.68 mg/dL (ref 0.50–0.99)
Globulin: 2.6 g/dL (calc) (ref 1.9–3.7)
Glucose, Bld: 78 mg/dL (ref 65–99)
Potassium: 4.2 mmol/L (ref 3.5–5.3)
Sodium: 141 mmol/L (ref 135–146)
Total Bilirubin: 0.4 mg/dL (ref 0.2–1.2)
Total Protein: 6.6 g/dL (ref 6.1–8.1)
eGFR: 110 mL/min/{1.73_m2} (ref >=60)

## 2023-04-05 LAB — TSH: TSH: 2.87 mIU/L

## 2023-04-05 LAB — CBC WITH DIFFERENTIAL/PLATELET
Absolute Monocytes: 484 cells/uL (ref 200–950)
Basophils Absolute: 37 cells/uL (ref 0–200)
Basophils Relative: 0.4 %
Eosinophils Relative: 2.9 %
HCT: 38.9 % (ref 35.0–45.0)
Hemoglobin: 12.2 g/dL (ref 11.7–15.5)
Lymphs Abs: 1869 cells/uL (ref 850–3900)
MCHC: 31.4 g/dL — ABNORMAL LOW (ref 32.0–36.0)
MPV: 9.4 fL (ref 7.5–12.5)
Neutro Abs: 6640 cells/uL (ref 1500–7800)
Platelets: 366 10*3/uL (ref 140–400)
RBC: 4.42 10*6/uL (ref 3.80–5.10)
RDW: 13.2 % (ref 11.0–15.0)
WBC: 9.3 10*3/uL (ref 3.8–10.8)

## 2023-04-05 LAB — LIPID PANEL
HDL: 43 mg/dL — ABNORMAL LOW (ref >=50)
LDL Cholesterol (Calc): 81 mg/dL (calc)
Non-HDL Cholesterol (Calc): 106 mg/dL (calc) (ref <130)
Triglycerides: 151 mg/dL — ABNORMAL HIGH (ref <150)

## 2023-04-05 LAB — IRON,TIBC AND FERRITIN PANEL
%SAT: 15 % (calc) — ABNORMAL LOW (ref 16–45)
Ferritin: 68 ng/mL (ref 16–232)
TIBC: 319 mcg/dL (calc) (ref 250–450)

## 2023-04-05 LAB — HEMOGLOBIN A1C
Hgb A1c MFr Bld: 5.3 % of total Hgb (ref <5.7)
Mean Plasma Glucose: 105 mg/dL
eAG (mmol/L): 5.8 mmol/L

## 2023-04-05 NOTE — Assessment & Plan Note (Signed)
Improved control On Escitalopram 10mg  daily On Buspar 5mg  twice a day regularly, needs re order.

## 2023-04-05 NOTE — Assessment & Plan Note (Signed)
Weight gain in past 4+ months Encourage lifestyle modification diet and exercise regimen

## 2023-04-05 NOTE — Assessment & Plan Note (Signed)
Well-controlled HYPERTENSION currently Now on CPAP On med management Complication OSA Obesity   Plan:  1. Continue current BP regimen - Amlodipine 5mg  daily, Valsartan 80mg  daily 2. Encourage improved lifestyle - low sodium diet, regular exercise 3. Continue monitor BP outside office, bring readings to next visit, if persistently >140/90 or new symptoms notify office sooner  Future if OSA and Wt controlled would consider dose reduction in meds.

## 2023-04-05 NOTE — Assessment & Plan Note (Signed)
Well controlled, chronic OSA on CPAP, now - Good adherence to CPAP nightly - Continue current CPAP therapy, patient seems to be benefiting from therapy  

## 2023-06-19 ENCOUNTER — Ambulatory Visit (INDEPENDENT_AMBULATORY_CARE_PROVIDER_SITE_OTHER): Payer: Self-pay | Admitting: Internal Medicine

## 2023-06-19 VITALS — BP 150/106 | HR 73 | Resp 16 | Ht 63.5 in | Wt 280.0 lb

## 2023-06-19 DIAGNOSIS — G4733 Obstructive sleep apnea (adult) (pediatric): Secondary | ICD-10-CM | POA: Diagnosis not present

## 2023-06-19 DIAGNOSIS — F32A Depression, unspecified: Secondary | ICD-10-CM

## 2023-06-19 DIAGNOSIS — Z6841 Body Mass Index (BMI) 40.0 and over, adult: Secondary | ICD-10-CM

## 2023-06-19 DIAGNOSIS — I10 Essential (primary) hypertension: Secondary | ICD-10-CM

## 2023-06-19 DIAGNOSIS — F419 Anxiety disorder, unspecified: Secondary | ICD-10-CM | POA: Diagnosis not present

## 2023-06-19 DIAGNOSIS — Z7189 Other specified counseling: Secondary | ICD-10-CM

## 2023-06-19 NOTE — Progress Notes (Signed)
[facility] [address] [place], [place] [address]  Pulmonary Sleep Medicine   Office Visit Note  Patient Name: Elizabeth Schultz DOB: 1976/06/24 MRN [id number]    Chief Complaint: Obstructive Sleep Apnea visit  Brief History:  Elizabeth Schultz is seen today for an initial consult for CPAP@ 7 cmH2O. The patient has a 4 month history of sleep apnea. Patient is not using PAP nightly.  The patient feels rested after sleeping with PAP.  The patient reports benefiting from PAP use. Reported sleepiness is  improved and the Epworth Sleepiness Score is 4 out of 24. The patient will rarely take naps. The patient complains of the following: would like to try alternative mask/headgear. The compliance download shows 50% compliance with an average use time of 5 hours 26 minutes. The AHI is 1.3.  The patient does not complain of limb movements disrupting sleep. The patient continues to require PAP therapy in order to eliminate sleep apnea.   ROS  General: (-) fever, (-) chills, (-) night sweat Nose and Sinuses: (-) nasal stuffiness or itchiness, (-) postnasal drip, (-) nosebleeds, (-) sinus trouble. Mouth and Throat: (-) sore throat, (-) hoarseness. Neck: (-) swollen glands, (-) enlarged thyroid, (-) neck pain. Respiratory: - cough, - shortness of breath, + wheezing. Neurologic: - numbness, - tingling. Psychiatric: - anxiety, - depression   Current Medication: Outpatient Encounter Medications as of 06/19/2023  Medication Sig   albuterol (VENTOLIN HFA) 108 (90 Base) MCG/ACT inhaler Inhale 1-2 puffs into the lungs every 6 (six) hours as needed.   amLODipine (NORVASC) 5 MG tablet Take 1 tablet (5 mg total) by mouth daily.   busPIRone (BUSPAR) 5 MG tablet Take 1 tablet (5 mg total) by mouth 3 (three) times daily.   escitalopram (LEXAPRO) 10 MG tablet TAKE 1 TABLET BY MOUTH EVERY DAY   ibuprofen (ADVIL) 600 MG tablet Take by mouth.   omeprazole (PRILOSEC) 20 MG capsule Take 1 capsule (20 mg  total) by mouth daily before breakfast.   valsartan (DIOVAN) 80 MG tablet Take 1 tablet (80 mg total) by mouth daily.   No facility-administered encounter medications on file as of 06/19/2023.    Surgical History: Past Surgical History:  Procedure Laterality Date   ANTERIOR CRUCIATE LIGAMENT REPAIR  05/2011   TUBAL LIGATION Bilateral 03/2003   WISDOM TOOTH EXTRACTION Bilateral 11/1995    Medical History: Past Medical History:  Diagnosis Date   Anxiety     Family History: Non contributory to the present illness  Social History: Social History   Socioeconomic History   Marital status: Married    Spouse name: Not on file   Number of children: Not on file   Years of education: Not on file   Highest education level: Not on file  Occupational History   Not on file  Tobacco Use   Smoking status: Never   Smokeless tobacco: Never  Substance and Sexual Activity   Alcohol use: Not Currently   Drug use: Never   Sexual activity: Yes  Other Topics Concern   Not on file  Social History Narrative   Not on file   Social Determinants of Health   Financial Resource Strain: Not on file  Food Insecurity: Not on file  Transportation Needs: Not on file  Physical Activity: Not on file  Stress: Not on file  Social Connections: Not on file  Intimate Partner Violence: Not on file    Vital Signs: Blood pressure (!) 150/106, pulse 73, resp. rate 16, height 5'  3.5" (1.613 m), weight 280 lb (127 kg), SpO2 98%. Body mass index is 48.82 kg/m.    Examination: General Appearance: The patient is well-developed, well-nourished, and in no distress. Neck Circumference: 45 cm Skin: Gross inspection of skin unremarkable. Head: normocephalic, no gross deformities. Eyes: no gross deformities noted. ENT: ears appear grossly normal Neurologic: Alert and oriented. No involuntary movements.  STOP BANG RISK ASSESSMENT S (snore) Have you been told that you snore?     NO   T (tired) Are you  often tired, fatigued, or sleepy during the day?   NO  O (obstruction) Do you stop breathing, choke, or gasp during sleep? NO   P (pressure) Do you have or are you being treated for high blood pressure? YES   B (BMI) Is your body index greater than 35 kg/m? YES   A (age) Are you 70 years old or older? NO   N (neck) Do you have a neck circumference greater than 16 inches?   YES   G (gender) Are you a female? NO   TOTAL STOP/BANG "YES" ANSWERS 3       A STOP-Bang score of 2 or less is considered low risk, and a score of 5 or more is high risk for having either moderate or severe OSA. For people who score 3 or 4, doctors may need to perform further assessment to determine how likely they are to have OSA.         EPWORTH SLEEPINESS SCALE:  Scale:  (0)= no chance of dozing; (1)= slight chance of dozing; (2)= moderate chance of dozing; (3)= high chance of dozing  Chance  Situtation    Sitting and reading: 0    Watching TV: 1    Sitting Inactive in public: 0    As a passenger in car: 1      Lying down to rest: 2    Sitting and talking: 0    Sitting quielty after lunch: 0    In a car, stopped in traffic: 0   TOTAL SCORE:   4 out of 24    SLEEP STUDIES:  PSG (02/2023) AHI 9/hr, REM AHI 30/hr, min SpO2 81%, PLMI 13 Titration (02/2023) CPAP@ 7 cmH2O   CPAP COMPLIANCE DATA:  Date Range: 04/16/2023-06/14/2023  Average Daily Use: 5 hours 26 minutes  Median Use: 6 hours 7 minutes  Compliance for > 4 Hours: 50%  AHI: 1.3 respiratory events per hour  Days Used: 41/60 days  Mask Leak: 6.3  95th Percentile Pressure: 7         LABS: Recent Results (from the past 2160 hour(s))  COMPLETE METABOLIC PANEL WITH GFR     Status: Abnormal   Collection Time: 04/04/23  8:45 AM  Result Value Ref Range   Glucose, Bld 78 65 - 99 mg/dL    Comment: .            Fasting reference interval .    BUN 11 7 - 25 mg/dL   Creat 1.61 0.96 - 0.45 mg/dL   eGFR 409 > OR = 60  WJ/XBJ/4.78G9   BUN/Creatinine Ratio SEE NOTE: 6 - 22 (calc)    Comment:    Not Reported: BUN and Creatinine are within    reference range. .    Sodium 141 135 - 146 mmol/L   Potassium 4.2 3.5 - 5.3 mmol/L   Chloride 103 98 - 110 mmol/L   CO2 29 20 - 32 mmol/L   Calcium 8.4 (L) 8.6 -  10.2 mg/dL   Total Protein 6.6 6.1 - 8.1 g/dL   Albumin 4.0 3.6 - 5.1 g/dL   Globulin 2.6 1.9 - 3.7 g/dL (calc)   AG Ratio 1.5 1.0 - 2.5 (calc)   Total Bilirubin 0.4 0.2 - 1.2 mg/dL   Alkaline phosphatase (APISO) 78 31 - 125 U/L   AST 9 (L) 10 - 30 U/L   ALT 11 6 - 29 U/L  CBC with Differential/Platelet     Status: Abnormal   Collection Time: 04/04/23  8:45 AM  Result Value Ref Range   WBC 9.3 3.8 - 10.8 Thousand/uL   RBC 4.42 3.80 - 5.10 Million/uL   Hemoglobin 12.2 11.7 - 15.5 g/dL   HCT 25.9 56.3 - 87.5 %   MCV 88.0 80.0 - 100.0 fL   MCH 27.6 27.0 - 33.0 pg   MCHC 31.4 (L) 32.0 - 36.0 g/dL   RDW 64.3 32.9 - 51.8 %   Platelets 366 140 - 400 Thousand/uL   MPV 9.4 7.5 - 12.5 fL   Neutro Abs 6,640 1,500 - 7,800 cells/uL   Lymphs Abs 1,869 850 - 3,900 cells/uL   Absolute Monocytes 484 200 - 950 cells/uL   Eosinophils Absolute 270 15 - 500 cells/uL   Basophils Absolute 37 0 - 200 cells/uL   Neutrophils Relative % 71.4 %   Total Lymphocyte 20.1 %   Monocytes Relative 5.2 %   Eosinophils Relative 2.9 %   Basophils Relative 0.4 %  Hemoglobin A1c     Status: None   Collection Time: 04/04/23  8:45 AM  Result Value Ref Range   Hgb A1c MFr Bld 5.3 <5.7 % of total Hgb    Comment: For the purpose of screening for the presence of diabetes: . <5.7%       Consistent with the absence of diabetes 5.7-6.4%    Consistent with increased risk for diabetes             (prediabetes) > or =6.5%  Consistent with diabetes . This assay result is consistent with a decreased risk of diabetes. . Currently, no consensus exists regarding use of hemoglobin A1c for diagnosis of diabetes in children. . According  to American Diabetes Association (ADA) guidelines, hemoglobin A1c <7.0% represents optimal control in non-pregnant diabetic patients. Different metrics may apply to specific patient populations.  Standards of Medical Care in Diabetes(ADA). .    Mean Plasma Glucose 105 mg/dL   eAG (mmol/L) 5.8 mmol/L    Comment: . This test was performed on the Roche cobas c503 platform. Effective 07/24/22, a change in test platforms from the Abbott Architect to the Roche cobas c503 may have shifted HbA1c results compared to historical results. Based on laboratory validation testing conducted at [facility], the Roche platform relative to the Abbott platform had an average increase in HbA1c value of < or = 0.3%. This difference is within accepted  variability established by the [facility]. Note that not all individuals will have had a shift in their results and direct comparisons between historical and current results for testing conducted on different platforms is not recommended.   Lipid panel     Status: Abnormal   Collection Time: 04/04/23  8:45 AM  Result Value Ref Range   Cholesterol 149 <200 mg/dL   HDL 43 (L) > OR = 50 mg/dL   Triglycerides 841 (H) <150 mg/dL   LDL Cholesterol (Calc) 81 mg/dL (calc)    Comment: Reference range: <100 . Desirable range <100 mg/dL  for primary prevention;   <70 mg/dL for patients with CHD or diabetic patients  with > or = 2 CHD risk factors. Marland Kitchen LDL-C is now calculated using the Martin-Hopkins  calculation, which is a validated novel method providing  better accuracy than the Friedewald equation in the  estimation of LDL-C.  Horald Pollen et al. Lenox Ahr. 1914;782(95): 2061-2068  (http://education.[facility].com/faq/FAQ164)    Total CHOL/HDL Ratio 3.5 <5.0 (calc)   Non-HDL Cholesterol (Calc) 106 <130 mg/dL (calc)    Comment: For patients with diabetes plus 1 major ASCVD risk  factor, treating to a non-HDL-C goal of <100  mg/dL  (LDL-C of <62 mg/dL) is considered a therapeutic  option.   Iron, TIBC and Ferritin Panel     Status: Abnormal   Collection Time: 04/04/23  8:45 AM  Result Value Ref Range   Iron 48 40 - 190 mcg/dL   TIBC 130 865 - 784 mcg/dL (calc)   %SAT 15 (L) 16 - 45 % (calc)   Ferritin 68 16 - 232 ng/mL  TSH     Status: None   Collection Time: 04/04/23  8:45 AM  Result Value Ref Range   TSH 2.87 mIU/L    Comment:           Reference Range .           > or = 20 Years  0.40-4.50 .                Pregnancy Ranges           First trimester    0.26-2.66           Second trimester   0.55-2.73           Third trimester    0.43-2.91     Radiology: MM DIAG BREAST TOMO BILATERAL  Result Date: 04/06/2021 CLINICAL DATA:  Recent weight loss of 50lbs with newly palpable area in the RIGHT breast. Strong family history of breast cancer EXAM: DIGITAL DIAGNOSTIC BILATERAL MAMMOGRAM WITH TOMOSYNTHESIS AND CAD; ULTRASOUND RIGHT BREAST LIMITED TECHNIQUE: Bilateral digital diagnostic mammography and breast tomosynthesis was performed. The images were evaluated with computer-aided detection.; Targeted ultrasound examination of the right breast was performed COMPARISON:  None.  Baseline ACR Breast Density Category d: The breast tissue is extremely dense, which lowers the sensitivity of mammography. FINDINGS: Spot compression tomosynthesis views were obtained over the palpable area of concern in the RIGHT breast. No suspicious mammographic finding is identified in this area. No suspicious mass, microcalcification, or other finding is identified in either breast. On physical exam, there is a firm ridge of tissue at the site of palpable concern. Targeted RIGHT breast ultrasound was performed in the palpable area of concern at the upper inner breast. No suspicious solid or cystic mass is identified. There is a prominent ridge of dense fibroglandular tissue noted at the site of palpable concern. During real-time  examination, multiple scattered benign cysts were incidentally noted with representative 3 mm simple cyst documented at 2 o'clock 5 cm from the nipple. IMPRESSION: 1. No mammographic or sonographic evidence of malignancy at the site of palpable concern. There is incidental note of scattered benign subcentimeter cysts. Any further workup of the patient's symptoms should be based on the clinical assessment. Recommend routine annual screening mammogram in 1 year. 2. No mammographic evidence of malignancy bilaterally. RECOMMENDATION: Screening mammogram in one year.(Code:SM-B-01Y) The American Cancer Society recommends annual MRI and mammography in patients with an estimated lifetime risk of developing breast cancer greater  than 20 - 25%, or who are known or suspected to be positive for the breast cancer gene. I have discussed the findings and recommendations with the patient. If applicable, a reminder letter will be sent to the patient regarding the next appointment. BI-RADS CATEGORY  2: Benign. Electronically Signed   By: Meda Klinefelter MD   On: 04/06/2021 16:32  US BREAST LTD UNI RIGHT INC AXILLA  Result Date: 04/06/2021 CLINICAL DATA:  Recent weight loss of 50lbs with newly palpable area in the RIGHT breast. Strong family history of breast cancer EXAM: DIGITAL DIAGNOSTIC BILATERAL MAMMOGRAM WITH TOMOSYNTHESIS AND CAD; ULTRASOUND RIGHT BREAST LIMITED TECHNIQUE: Bilateral digital diagnostic mammography and breast tomosynthesis was performed. The images were evaluated with computer-aided detection.; Targeted ultrasound examination of the right breast was performed COMPARISON:  None.  Baseline ACR Breast Density Category d: The breast tissue is extremely dense, which lowers the sensitivity of mammography. FINDINGS: Spot compression tomosynthesis views were obtained over the palpable area of concern in the RIGHT breast. No suspicious mammographic finding is identified in this area. No suspicious mass,  microcalcification, or other finding is identified in either breast. On physical exam, there is a firm ridge of tissue at the site of palpable concern. Targeted RIGHT breast ultrasound was performed in the palpable area of concern at the upper inner breast. No suspicious solid or cystic mass is identified. There is a prominent ridge of dense fibroglandular tissue noted at the site of palpable concern. During real-time examination, multiple scattered benign cysts were incidentally noted with representative 3 mm simple cyst documented at 2 o'clock 5 cm from the nipple. IMPRESSION: 1. No mammographic or sonographic evidence of malignancy at the site of palpable concern. There is incidental note of scattered benign subcentimeter cysts. Any further workup of the patient's symptoms should be based on the clinical assessment. Recommend routine annual screening mammogram in 1 year. 2. No mammographic evidence of malignancy bilaterally. RECOMMENDATION: Screening mammogram in one year.(Code:SM-B-01Y) The American Cancer Society recommends annual MRI and mammography in patients with an estimated lifetime risk of developing breast cancer greater than 20 - 25%, or who are known or suspected to be positive for the breast cancer gene. I have discussed the findings and recommendations with the patient. If applicable, a reminder letter will be sent to the patient regarding the next appointment. BI-RADS CATEGORY  2: Benign. Electronically Signed   By: Meda Klinefelter MD   On: 04/06/2021 16:32   No results found.  No results found.    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 The patient was  reminded how to adjust mask fit and advised to change supplies regularly. The patient was also counselled on nightly use. The compliance is poor. The AHI is 1.3. Patient continues to require PAP to treat their apnea and is medically necessary.  1. OSA on CPAP Continue nightly use  2. CPAP use counseling CPAP couseling-Discussed importance of adequate CPAP use as well as proper care and cleaning techniques of machine and all supplies.  3. Essential hypertension Continue current medication and f/u with PCP.  4. Anxiety and depression Continue current medication and f/u with PCP.  5. Morbid obesity  with BMI of 45.0-49.9, adult (HCC) Obesity Counseling: Had a lengthy discussion regarding patients BMI and weight issues. Patient was instructed on portion control as well as increased activity. Also discussed caloric restrictions with trying to maintain intake less than 2000 Kcal. Discussions were made in accordance with the 5As of weight management. Simple actions such as not eating late and if able to, taking a walk is suggested.    General Counseling: I have discussed the findings of the evaluation and examination with [place].  I have also discussed any further diagnostic evaluation thatmay be needed or ordered today. Halima verbalizes understanding of the findings of todays visit. We also reviewed her medications today and discussed drug interactions and side effects including but not limited excessive drowsiness and altered mental states. We also discussed that there is always a risk not just to her but also people around her. she has been encouraged to call the office with any questions or concerns that should arise related to todays visit.  No orders of the defined types were placed in this encounter.       I have personally obtained a history, examined the patient, evaluated laboratory and imaging results, formulated the assessment and plan and placed orders.  This patient was seen by  Lynn Ito, PA-C in collaboration with Dr. Freda Munro as a part of collaborative care agreement.  Yevonne Pax, MD [facility] Diplomate ABMS Pulmonary Critical Care Medicine and Sleep Medicine

## 2023-06-19 NOTE — Patient Instructions (Signed)

## 2023-07-11 ENCOUNTER — Encounter: Payer: Self-pay | Admitting: Family Medicine

## 2023-08-20 HISTORY — PX: BARIATRIC SURGERY: SHX1103

## 2023-08-31 ENCOUNTER — Other Ambulatory Visit: Payer: Self-pay | Admitting: Family Medicine

## 2023-08-31 DIAGNOSIS — F3341 Major depressive disorder, recurrent, in partial remission: Secondary | ICD-10-CM

## 2023-08-31 DIAGNOSIS — F411 Generalized anxiety disorder: Secondary | ICD-10-CM

## 2023-08-31 DIAGNOSIS — K219 Gastro-esophageal reflux disease without esophagitis: Secondary | ICD-10-CM

## 2023-09-03 NOTE — Telephone Encounter (Signed)
Requested Prescriptions  Pending Prescriptions Disp Refills   omeprazole (PRILOSEC) 20 MG capsule [Pharmacy Med Name: OMEPRAZOLE DR 20 MG CAPSULE] 90 capsule 2    Sig: TAKE 1 CAPSULE BY MOUTH DAILY BEFORE BREAKFAST.     Gastroenterology: Proton Pump Inhibitors Passed - 08/31/2023  3:19 PM      Passed - Valid encounter within last 12 months    Recent Outpatient Visits           5 months ago Annual physical exam   [facility] Elizabeth Cords, Elizabeth Schultz   8 months ago Essential hypertension   [facility] Elizabeth Cords, Elizabeth Schultz   9 months ago Elevated BP without diagnosis of hypertension   [place] [facility] Elizabeth Cords, Elizabeth Schultz   11 months ago GAD (generalized anxiety disorder)   [place] [facility] Elizabeth Cords, Elizabeth Schultz   1 year ago GAD (generalized anxiety disorder)   [place] [facility] Elizabeth Schultz, Elizabeth Neat, Elizabeth Schultz       Future Appointments             In 1 month Elizabeth Schultz, Elizabeth Neat, Elizabeth Schultz [facility]             escitalopram (LEXAPRO) 10 MG tablet [Pharmacy Med Name: ESCITALOPRAM 10 MG TABLET] 90 tablet 0    Sig: TAKE 1 TABLET BY MOUTH EVERY DAY     Psychiatry:  Antidepressants - SSRI Passed - 08/31/2023  3:19 PM      Passed - Completed PHQ-2 or PHQ-9 in the last 360 days      Passed - Valid encounter within last 6 months    Recent Outpatient Visits           5 months ago Annual physical exam   [place] [facility] Elizabeth Cords, Elizabeth Schultz   8 months ago Essential hypertension   [place] [facility] Elizabeth Cords, Elizabeth Schultz   9 months ago Elevated BP without diagnosis of hypertension   [place] [facility] Elizabeth Cords, Elizabeth Schultz   11 months ago GAD (generalized anxiety disorder)   [place] [facility]  Elizabeth Cords, Elizabeth Schultz   1 year ago GAD (generalized anxiety disorder)    [facility] Elizabeth Cords, Elizabeth Schultz       Future Appointments             In 1 month Elizabeth Schultz, Elizabeth Neat, Elizabeth Schultz  [facility]

## 2023-10-04 ENCOUNTER — Ambulatory Visit: Payer: BC Managed Care – PPO | Admitting: Family Medicine

## 2023-10-04 ENCOUNTER — Encounter: Payer: Self-pay | Admitting: Family Medicine

## 2023-10-04 VITALS — BP 102/64 | HR 64 | Ht 63.5 in | Wt 245.0 lb

## 2023-10-04 DIAGNOSIS — E569 Vitamin deficiency, unspecified: Secondary | ICD-10-CM

## 2023-10-04 DIAGNOSIS — I10 Essential (primary) hypertension: Secondary | ICD-10-CM | POA: Diagnosis not present

## 2023-10-04 DIAGNOSIS — Z6841 Body Mass Index (BMI) 40.0 and over, adult: Secondary | ICD-10-CM

## 2023-10-04 DIAGNOSIS — F3341 Major depressive disorder, recurrent, in partial remission: Secondary | ICD-10-CM

## 2023-10-04 DIAGNOSIS — G4733 Obstructive sleep apnea (adult) (pediatric): Secondary | ICD-10-CM

## 2023-10-04 DIAGNOSIS — D509 Iron deficiency anemia, unspecified: Secondary | ICD-10-CM

## 2023-10-04 DIAGNOSIS — F411 Generalized anxiety disorder: Secondary | ICD-10-CM

## 2023-10-04 MED ORDER — ESCITALOPRAM OXALATE 10 MG PO TABS: 10.0000 mg | ORAL_TABLET | Freq: Every day | ORAL | 3 refills | Status: DC

## 2023-10-04 NOTE — Progress Notes (Signed)
Subjective:    Patient ID: Elizabeth Schultz, female    DOB: [birth year], 47 y.o.   MRN: [id number]  Elizabeth Schultz is a 47 y.o. female presenting on 10/04/2023 for Annual Exam   HPI  Discussed the use of AI scribe software for clinical note transcription with the patient, who gave verbal consent to proceed.  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 She continues to use her CPAP machine, but notes that she does not snore during daytime naps without the machine. Using CPAP machine often and tolerates well with improvement. Since weight loss 40+ lbs and also using CPAP but no longer snoring or having breathing problems.  Anxiety Controlled On Escitalopram 10mg  daily Buspar 5mg  from 3 times per day down to 2 per day    However, the patient has been struggling with low iron levels, a pre-existing condition that has been exacerbated by the bariatric surgery. She is currently in contact with her hematologist to discuss the possibility of iron infusions, as her body is unable to absorb oral iron.  The patient's blood pressure has been stable, with recent readings being lower than previous measurements.  Current meds - Amlodipine 5mg  daily and Valsartan 80mg  daily Note she had swelling on Amlodipine 10mg  Reports good compliance, took meds today. Tolerating well, w/o complaints. Denies CP, dyspnea, HA, edema, dizziness / lightheadedness  The patient is currently taking a bariatric vitamin (ADEK) to address  malnutrition issues, but notes that her body is not absorbing the iron in the supplement. She is also taking Valsartan and Amlodipine for her hypertension, and Lexapro for her anxiety.      Health Maintenance:   Due for Pap Smear - will return to [facility] Dr Melvyn Neth   Due for Colon CA Screening age 82+, she will consider the Cologuard and contact us back.      10/04/2023    9:31 AM 04/04/2023    8:13 AM 01/01/2023    9:00 AM  Depression screen PHQ 2/9  Decreased Interest 0 0 0  Down, Depressed, Hopeless 0 1 1  PHQ - 2 Score 0 1 1  Altered sleeping 0 1 3  Tired, decreased energy 1 3 3   Change in appetite 0 3 3  Feeling bad or failure about yourself  0 3 2  Trouble concentrating 0 0 0  Moving slowly or fidgety/restless 0 0 0  Suicidal thoughts 0 0 0  PHQ-9 Score 1 11 12   Difficult doing work/chores   Not difficult at all       10/04/2023    9:32 AM 04/04/2023    8:13 AM 01/01/2023    9:00 AM 12/01/2022   10:10 AM  GAD 7 : Generalized Anxiety Score  Nervous, Anxious, on Edge 0 2 3 3   Control/stop worrying 1 1 1 1   Worry too much - different things 1 1 2 2   Trouble relaxing 0 3 3 3   Restless 0 0 0 3  Easily annoyed or irritable 0 0 0 0  Afraid -  awful might happen 0 0 0 0  Total GAD 7 Score 2 7 9 12   Anxiety Difficulty   Not difficult at all Not difficult at all    Social History   Tobacco Use   Smoking status: Never   Smokeless tobacco: Never  Substance Use Topics   Alcohol use: Not Currently   Drug use: Never    Review of Systems Per HPI unless specifically indicated above     Objective:    BP 102/64   Pulse 64   Ht 5' 3.5" (1.613 m)   Wt 245 lb (111.1 kg)   SpO2 99%   BMI 42.72 kg/m   Wt Readings from Last 3 Encounters:  10/04/23 245 lb (111.1 kg)  06/19/23 280 lb (127 kg)  04/04/23 281 lb (127.5 kg)    Physical Exam Vitals and nursing note reviewed.  Constitutional:      General: She is not in acute distress.    Appearance: Normal appearance.  She is well-developed. She is not diaphoretic.     Comments: Well-appearing, comfortable, cooperative  HENT:     Head: Normocephalic and atraumatic.  Eyes:     General:        Right eye: No discharge.        Left eye: No discharge.     Conjunctiva/sclera: Conjunctivae normal.  Cardiovascular:     Rate and Rhythm: Normal rate.  Pulmonary:     Effort: Pulmonary effort is normal.  Skin:    General: Skin is warm and dry.     Findings: No erythema or rash.  Neurological:     Mental Status: She is alert and oriented to person, place, and time.  Psychiatric:        Mood and Affect: Mood normal.        Behavior: Behavior normal.        Thought Content: Thought content normal.     Comments: Well groomed, good eye contact, normal speech and thoughts      Results for orders placed or performed in visit on 04/04/23  COMPLETE METABOLIC PANEL WITH GFR   Collection Time: 04/04/23  8:45 AM  Result Value Ref Range   Glucose, Bld 78 65 - 99 mg/dL   BUN 11 7 - 25 mg/dL   Creat 1.61 0.96 - 0.45 mg/dL   eGFR 409 > OR = 60 WJ/XBJ/4.78G9   BUN/Creatinine Ratio SEE NOTE: 6 - 22 (calc)   Sodium 141 135 - 146 mmol/L   Potassium 4.2 3.5 - 5.3 mmol/L   Chloride 103 98 - 110 mmol/L   CO2 29 20 - 32 mmol/L   Calcium 8.4 (L) 8.6 - 10.2 mg/dL   Total Protein 6.6 6.1 - 8.1 g/dL   Albumin 4.0 3.6 - 5.1 g/dL   Globulin 2.6 1.9 - 3.7 g/dL (calc)   AG Ratio 1.5 1.0 - 2.5 (calc)   Total Bilirubin 0.4 0.2 - 1.2 mg/dL   Alkaline phosphatase (APISO) 78 31 - 125 U/L   AST 9 (L) 10 - 30 U/L   ALT 11 6 - 29 U/L  CBC with Differential/Platelet   Collection Time: 04/04/23  8:45 AM  Result Value Ref Range   WBC 9.3 3.8 - 10.8 Thousand/uL   RBC 4.42 3.80 - 5.10 Million/uL   Hemoglobin 12.2 11.7 - 15.5 g/dL   HCT 56.2 13.0 - 86.5 %   MCV 88.0 80.0 - 100.0 fL   MCH 27.6 27.0 - 33.0 pg   MCHC 31.4 (L)  32.0 - 36.0 g/dL   RDW 16.1 09.6 - 04.5 %   Platelets 366 140 - 400 Thousand/uL   MPV 9.4 7.5 - 12.5 fL    Neutro Abs 6,640 1,500 - 7,800 cells/uL   Lymphs Abs 1,869 850 - 3,900 cells/uL   Absolute Monocytes 484 200 - 950 cells/uL   Eosinophils Absolute 270 15 - 500 cells/uL   Basophils Absolute 37 0 - 200 cells/uL   Neutrophils Relative % 71.4 %   Total Lymphocyte 20.1 %   Monocytes Relative 5.2 %   Eosinophils Relative 2.9 %   Basophils Relative 0.4 %  Hemoglobin A1c   Collection Time: 04/04/23  8:45 AM  Result Value Ref Range   Hgb A1c MFr Bld 5.3 <5.7 % of total Hgb   Mean Plasma Glucose 105 mg/dL   eAG (mmol/L) 5.8 mmol/L  Lipid panel   Collection Time: 04/04/23  8:45 AM  Result Value Ref Range   Cholesterol 149 <200 mg/dL   HDL 43 (L) > OR = 50 mg/dL   Triglycerides 409 (H) <150 mg/dL   LDL Cholesterol (Calc) 81 mg/dL (calc)   Total CHOL/HDL Ratio 3.5 <5.0 (calc)   Non-HDL Cholesterol (Calc) 106 <130 mg/dL (calc)  Iron, TIBC and Ferritin Panel   Collection Time: 04/04/23  8:45 AM  Result Value Ref Range   Iron 48 40 - 190 mcg/dL   TIBC 811 914 - 782 mcg/dL (calc)   %SAT 15 (L) 16 - 45 % (calc)   Ferritin 68 16 - 232 ng/mL  TSH   Collection Time: 04/04/23  8:45 AM  Result Value Ref Range   TSH 2.87 mIU/L      Assessment & Plan:   Problem List Items Addressed This Visit     Essential hypertension   GAD (generalized anxiety disorder)   Relevant Medications   escitalopram (LEXAPRO) 10 MG tablet   Major depressive disorder, recurrent, in partial remission (HCC)   Relevant Medications   escitalopram (LEXAPRO) 10 MG tablet   Morbid obesity with BMI of 40.0-44.9, adult (HCC) - Primary   Multiple vitamin deficiency disease   Relevant Orders   Vitamin B12   Vitamin B1   Iron, TIBC and Ferritin Panel   COMPLETE METABOLIC PANEL WITH GFR   CBC with Differential/Platelet   VITAMIN D 25 Hydroxy (Vit-D Deficiency, Fractures)   Reticulocytes   Vitamin A   Zinc   Copper, serum   Folate   Prealbumin   OSA on CPAP   Other Visit Diagnoses       Iron deficiency anemia,  unspecified iron deficiency anemia type       Relevant Orders   Vitamin B12   Iron, TIBC and Ferritin Panel   COMPLETE METABOLIC PANEL WITH GFR   CBC with Differential/Platelet   Reticulocytes   Folate         Post-Bariatric Surgery Significant weight loss (from 280 lbs to 245 lbs) following Single Anastomosis Duodenal Ileal (SADI) switch. Iron deficiency due to malabsorption. -Continue with bariatric vitamins (ADEK). -Contact Dr. Harlow Ohms regarding potential need for iron IV infusions. -Schedule bariatric labs for 11/26/2023.  Hypertension Blood pressure well controlled, possibly over controlled due to weight loss. -Reduce Valsartan from 80mg  to 40mg  daily. (Take half tab for now). -Consider discontinuing Valsartan after 2+ weeks if blood pressure remains stable.Marland Kitchen Ultimately may be able to come off BP medications -Monitor blood pressure at home. -Consider discontinuing Valsartan after 2+ weeks if blood pressure remains stable.  Anxiety Improvement noted,  reduced Buspar from 3 tablets daily to 2 tablets daily. -Continue with current regimen of 2 tablets of Buspar daily and stable dose of Lexapro.  Follow-up -Schedule 45-month physical appointment for 04/03/2024. -Schedule lab appointment for 11/26/2023.         Orders Placed This Encounter  Procedures   Vitamin B12    Standing Status:   Future    Expected Date:   11/26/2023    Expiration Date:   10/03/2024   Vitamin B1    Standing Status:   Future    Expected Date:   11/26/2023    Expiration Date:   10/03/2024   Iron, TIBC and Ferritin Panel    Standing Status:   Future    Expected Date:   11/26/2023    Expiration Date:   10/03/2024   COMPLETE METABOLIC PANEL WITH GFR    Standing Status:   Future    Expected Date:   11/26/2023    Expiration Date:   10/03/2024   CBC with Differential/Platelet    Standing Status:   Future    Expected Date:   11/26/2023    Expiration Date:   10/03/2024   VITAMIN D 25 Hydroxy (Vit-D  Deficiency, Fractures)    Standing Status:   Future    Expected Date:   11/26/2023    Expiration Date:   10/03/2024   Reticulocytes    Standing Status:   Future    Expected Date:   11/26/2023    Expiration Date:   10/03/2024   Vitamin A    Standing Status:   Future    Expected Date:   11/26/2023    Expiration Date:   10/03/2024   Zinc    Standing Status:   Future    Expected Date:   11/26/2023    Expiration Date:   10/03/2024   Copper, serum    Standing Status:   Future    Expected Date:   11/26/2023    Expiration Date:   10/03/2024   Folate    Standing Status:   Future    Expected Date:   11/26/2023    Expiration Date:   10/03/2024   Prealbumin    Standing Status:   Future    Expected Date:   11/26/2023    Expiration Date:   10/03/2024    Meds ordered this encounter  Medications   escitalopram (LEXAPRO) 10 MG tablet    Sig: Take 1 tablet (10 mg total) by mouth daily.    Dispense:  90 tablet    Refill:  3    Add refills    Follow up plan: Return in about 6 months (around 04/03/2024) for 6 month Annual Physical AM apt - prefer 04/03/24 and can do labs AFTER.  Future labs ordered for  ADD BARIATRIC LABS for 11/26/23 830am  FAX results to [facility] Fax [phone]   Saralyn Pilar, DO [facility] 10/04/2023, 9:56 AM

## 2023-10-04 NOTE — Patient Instructions (Addendum)
Thank you for coming to the office today.  Try to reduce BP medications  Start with Valsartan 80mg  down to 40mg , take half tab Continue Amlodipine 5mg  daily  Monitor BP Goal < 135 / 85, if doing well can then STOP valsartan after 2+ weeks Or can adjust Amlodipine Let me know  -------------- Labs on 11/26/23   DUE for FASTING BLOOD WORK (no food or drink after midnight before the lab appointment, only water or coffee without cream/sugar on the morning of)  SCHEDULE "Lab Only" visit in the morning at the clinic for lab draw on 11/26/23  - Make sure Lab Only appointment is at about 1 week before your next appointment, so that results will be available  For Lab Results, once available within 2-3 days of blood draw, you can can log in to MyChart online to view your results and a brief explanation. Also, we can discuss results at next follow-up visit.   Please schedule a Follow-up Appointment to: Return in about 6 months (around 04/03/2024) for 6 month Annual Physical AM apt - prefer 04/03/24 and can do labs AFTER.  If you have any other questions or concerns, please feel free to call the office or send a message through MyChart. You may also schedule an earlier appointment if necessary.  Additionally, you may be receiving a survey about your experience at our office within a few days to 1 week by e-mail or mail. We value your feedback.  Saralyn Pilar, DO [facility], [place]

## 2023-10-18 ENCOUNTER — Encounter: Payer: Self-pay | Admitting: Family Medicine

## 2023-11-26 ENCOUNTER — Other Ambulatory Visit: Payer: Self-pay

## 2023-11-26 DIAGNOSIS — D509 Iron deficiency anemia, unspecified: Secondary | ICD-10-CM

## 2023-11-26 DIAGNOSIS — E569 Vitamin deficiency, unspecified: Secondary | ICD-10-CM

## 2023-11-29 ENCOUNTER — Encounter: Payer: Self-pay | Admitting: Family Medicine

## 2023-11-29 DIAGNOSIS — E538 Deficiency of other specified B group vitamins: Secondary | ICD-10-CM

## 2023-12-01 LAB — CBC WITH DIFFERENTIAL/PLATELET
Absolute Lymphocytes: 2224 {cells}/uL (ref 850–3900)
Absolute Monocytes: 598 {cells}/uL (ref 200–950)
Basophils Absolute: 50 {cells}/uL (ref 0–200)
Basophils Relative: 0.6 %
Eosinophils Absolute: 232 {cells}/uL (ref 15–500)
Eosinophils Relative: 2.8 %
HCT: 40.1 % (ref 35.0–45.0)
Hemoglobin: 12.8 g/dL (ref 11.7–15.5)
MCH: 27 pg (ref 27.0–33.0)
MCHC: 31.9 g/dL — ABNORMAL LOW (ref 32.0–36.0)
MCV: 84.6 fL (ref 80.0–100.0)
MPV: 10.7 fL (ref 7.5–12.5)
Monocytes Relative: 7.2 %
Neutro Abs: 5196 {cells}/uL (ref 1500–7800)
Neutrophils Relative %: 62.6 %
Platelets: 348 10*3/uL (ref 140–400)
RBC: 4.74 10*6/uL (ref 3.80–5.10)
RDW: 13 % (ref 11.0–15.0)
Total Lymphocyte: 26.8 %
WBC: 8.3 10*3/uL (ref 3.8–10.8)

## 2023-12-01 LAB — COMPLETE METABOLIC PANEL WITH GFR
AG Ratio: 1.5 (calc) (ref 1.0–2.5)
ALT: 18 U/L (ref 6–29)
AST: 13 U/L (ref 10–35)
Albumin: 4 g/dL (ref 3.6–5.1)
Alkaline phosphatase (APISO): 105 U/L (ref 31–125)
BUN: 11 mg/dL (ref 7–25)
CO2: 28 mmol/L (ref 20–32)
Calcium: 9.1 mg/dL (ref 8.6–10.2)
Chloride: 104 mmol/L (ref 98–110)
Creat: 0.72 mg/dL (ref 0.50–0.99)
Globulin: 2.7 g/dL (ref 1.9–3.7)
Glucose, Bld: 88 mg/dL (ref 65–99)
Potassium: 3.9 mmol/L (ref 3.5–5.3)
Sodium: 140 mmol/L (ref 135–146)
Total Bilirubin: 0.5 mg/dL (ref 0.2–1.2)
Total Protein: 6.7 g/dL (ref 6.1–8.1)
eGFR: 105 mL/min/{1.73_m2} (ref >=60)

## 2023-12-01 LAB — FOLATE: Folate: 4.7 ng/mL — ABNORMAL LOW

## 2023-12-01 LAB — RETICULOCYTES
ABS Retic: 47400 {cells}/uL (ref 20000–80000)
Retic Ct Pct: 1 %

## 2023-12-01 LAB — IRON,TIBC AND FERRITIN PANEL
%SAT: 14 % — ABNORMAL LOW (ref 16–45)
Ferritin: 53 ng/mL (ref 16–232)
Iron: 42 ug/dL (ref 40–190)
TIBC: 298 ug/dL (ref 250–450)

## 2023-12-01 LAB — VITAMIN D 25 HYDROXY (VIT D DEFICIENCY, FRACTURES): Vit D, 25-Hydroxy: 47 ng/mL (ref 30–100)

## 2023-12-01 LAB — VITAMIN B1: Vitamin B1 (Thiamine): 10 nmol/L (ref 8–30)

## 2023-12-01 LAB — PREALBUMIN: Prealbumin: 15 mg/dL — ABNORMAL LOW (ref 17–34)

## 2023-12-01 LAB — VITAMIN A: Vitamin A (Retinoic Acid): 32 ug/dL — ABNORMAL LOW (ref 38–98)

## 2023-12-01 LAB — COPPER, SERUM: Copper: 112 ug/dL (ref 70–175)

## 2023-12-01 LAB — VITAMIN B12: Vitamin B-12: 301 pg/mL (ref 200–1100)

## 2023-12-01 LAB — ZINC: Zinc: 94 ug/dL (ref 60–130)

## 2023-12-06 ENCOUNTER — Other Ambulatory Visit: Payer: Self-pay | Admitting: Family Medicine

## 2023-12-06 DIAGNOSIS — E569 Vitamin deficiency, unspecified: Secondary | ICD-10-CM

## 2023-12-06 DIAGNOSIS — E538 Deficiency of other specified B group vitamins: Secondary | ICD-10-CM

## 2023-12-06 MED ORDER — CYANOCOBALAMIN 1000 MCG/ML IJ SOLN: 1000.0000 ug | INTRAMUSCULAR | 0 refills | Status: DC

## 2023-12-26 ENCOUNTER — Other Ambulatory Visit: Payer: Self-pay | Admitting: Family Medicine

## 2023-12-26 DIAGNOSIS — E538 Deficiency of other specified B group vitamins: Secondary | ICD-10-CM

## 2023-12-26 DIAGNOSIS — E569 Vitamin deficiency, unspecified: Secondary | ICD-10-CM

## 2023-12-26 NOTE — Telephone Encounter (Signed)
 Requested medication (s) are due for refill today: see below  Requested medication (s) are on the active medication list: yes  Last refill:  12/06/23  Future visit scheduled: yes  Notes to clinic:    Pharmacy comment: REQUEST FOR 90 DAYS PRESCRIPTION. DX Code Needed.       Requested Prescriptions  Pending Prescriptions Disp Refills   cyanocobalamin (VITAMIN B12) 1000 MCG/ML injection [Pharmacy Med Name: CYANOCOBALAMIN 1,000 MCG/ML VL] 12 mL 1    Sig: INJECT 1 ML (1,000 MCG TOTAL) INTO THE MUSCLE ONCE A WEEK. FOR 4 WEEKS     Endocrinology:  Vitamins - Vitamin B12 Passed - 12/26/2023  5:14 PM      Passed - HCT in normal range and within 360 days    HCT  Date Value Ref Range Status  11/26/2023 40.1 35.0 - 45.0 % Final         Passed - HGB in normal range and within 360 days    Hemoglobin  Date Value Ref Range Status  11/26/2023 12.8 11.7 - 15.5 g/dL Final         Passed - B12 Level in normal range and within 360 days    Vitamin B-12  Date Value Ref Range Status  11/26/2023 301 200 - 1,100 pg/mL Final    Comment:    . Please Note: Although the reference range for vitamin B12 is [phone] pg/mL, it has been reported that between 5 and 10% of patients with values between 200 and 400 pg/mL may experience neuropsychiatric and hematologic abnormalities due to occult B12 deficiency; less than 1% of patients with values above 400 pg/mL will have symptoms. Verna Czech - Valid encounter within last 12 months    Recent Outpatient Visits           2 months ago Morbid obesity with BMI of 40.0-44.9, adult [facility])   [facility] [place], Netta Neat, DO   8 months ago Annual physical exam   [facility] [place] Smitty Cords, DO   11 months ago Essential hypertension   [facility] Smitty Cords, DO   1 year ago Elevated BP without diagnosis of hypertension   [place]  [facility] Smitty Cords, DO   1 year ago GAD (generalized anxiety disorder)   [facility] Althea Charon, Netta Neat, DO       Future Appointments             In 3 months Althea Charon, Netta Neat, DO [facility] [place], [facility]

## 2024-01-03 MED ORDER — BD SYRINGE/NEEDLE 22G X 1-1/2" 3 ML MISC: 0 refills | Status: DC

## 2024-01-03 MED ORDER — CYANOCOBALAMIN 1000 MCG/ML IJ SOLN
1000.0000 ug | INTRAMUSCULAR | 0 refills | Status: AC
Start: 2024-01-03 — End: ?

## 2024-02-05 ENCOUNTER — Other Ambulatory Visit: Payer: Self-pay | Admitting: Family Medicine

## 2024-02-05 DIAGNOSIS — I10 Essential (primary) hypertension: Secondary | ICD-10-CM

## 2024-02-05 NOTE — Telephone Encounter (Signed)
 Requested Prescriptions  Pending Prescriptions Disp Refills   valsartan  (DIOVAN ) 80 MG tablet [Pharmacy Med Name: VALSARTAN  80 MG TABLET] 90 tablet 0    Sig: TAKE 1 TABLET BY MOUTH EVERY DAY     Cardiovascular:  Angiotensin Receptor Blockers Failed - 02/05/2024  4:19 PM      Failed - Valid encounter within last 6 months    Recent Outpatient Visits   None     Future Appointments             In 1 month Karamalegos, Kayleen Party, DO [facility]            Passed - Cr in normal range and within 180 days    Creat  Date Value Ref Range Status  11/26/2023 0.72 0.50 - 0.99 mg/dL Final         Passed - K in normal range and within 180 days    Potassium  Date Value Ref Range Status  11/26/2023 3.9 3.5 - 5.3 mmol/L Final         Passed - Patient is not pregnant      Passed - Last BP in normal range    BP Readings from Last 1 Encounters:  10/04/23 102/64

## 2024-03-26 ENCOUNTER — Other Ambulatory Visit: Payer: Self-pay | Admitting: Family Medicine

## 2024-03-26 DIAGNOSIS — E538 Deficiency of other specified B group vitamins: Secondary | ICD-10-CM

## 2024-03-28 NOTE — Telephone Encounter (Signed)
 Requested medication (s) are due for refill today: na   Requested medication (s) are on the active medication list: yes   Last refill:  01/03/24 #4 ml 0 refills  Future visit scheduled: yes in 6 days   Notes to clinic:  no refills remain. Do you want to refill Rx?     Requested Prescriptions  Pending Prescriptions Disp Refills   cyanocobalamin  (VITAMIN B12) 1000 MCG/ML injection [Pharmacy Med Name: CYANOCOBALAMIN  1,000 MCG/ML VL] 3 mL 1    Sig: INJECT 1 ML (1,000 MCG TOTAL) INTO THE MUSCLE EVERY 30 DAYS.     Endocrinology:  Vitamins - Vitamin B12 Failed - 03/28/2024 10:35 AM      Failed - Valid encounter within last 12 months    Recent Outpatient Visits   None     Future Appointments             In 6 days [place], Kayleen Party, DO [facility]            Passed - HCT in normal range and within 360 days    HCT  Date Value Ref Range Status  11/26/2023 40.1 35.0 - 45.0 % Final         Passed - HGB in normal range and within 360 days    Hemoglobin  Date Value Ref Range Status  11/26/2023 12.8 11.7 - 15.5 g/dL Final         Passed - B12 Level in normal range and within 360 days    Vitamin B-12  Date Value Ref Range Status  11/26/2023 301 200 - 1,100 pg/mL Final    Comment:    . Please Note: Although the reference range for vitamin B12 is [phone] pg/mL, it has been reported that between 5 and 10% of patients with values between 200 and 400 pg/mL may experience neuropsychiatric and hematologic abnormalities due to occult B12 deficiency; less than 1% of patients with values above 400 pg/mL will have symptoms. Aaron Aas

## 2024-04-01 ENCOUNTER — Telehealth: Admitting: Physician Assistant

## 2024-04-01 DIAGNOSIS — T63461A Toxic effect of venom of wasps, accidental (unintentional), initial encounter: Secondary | ICD-10-CM

## 2024-04-01 MED ORDER — PREDNISONE 20 MG PO TABS: 40.0000 mg | ORAL_TABLET | Freq: Every day | ORAL | 0 refills | Status: DC

## 2024-04-01 NOTE — Progress Notes (Signed)
E-Visit for Insect Sting  Thank you for describing the insect sting for [place].  Here is how we plan to help!  A sting that we will treat with a short course of prednisone.  The 2 greatest risks from insect stings are allergic reaction, which can be fatal in some people and infection, which is more common and less serious.  Bees, wasps, yellow jackets, and hornets belong to a class of insects called Hymenoptera.  Most insect stings cause only minor discomfort.  Stings can happen anywhere on the body and can be painful.  Most stings are from honey bees or yellow jackets.  Fire ants can sting multiple times.  The sites of the stings are more likely to become infected.    Based on your information I have:, Provided a home care guide for insect stings and instructions on when to call for help., and I have sent in prednisone 40 mg by mouth daily for 5 days to the pharmacy you selected.  Please make sure that you selected a pharmacy that is open now.  What can be used to prevent Insect Stings?  Insect repellant with at least 20% DEET.  Wearing long pants and shirts with socks and shoes.  Wear dark or drab-colored clothes rather than bright colors.  Avoid using perfumes and hair sprays; these attract insects.  HOME CARE ADVICE:  1. Stinger removal: The stinger looks like a tiny black dot in the sting. Use a fingernail, credit card edge, or knife-edge to scrape it off.  Don't pull it out because it squeezes out more venom. If the stinger is below the skin surface, leave it alone.  It will be shed with normal skin healing. 2. Use cold compresses to the area of the sting for 10-20 minutes.  You may repeat this as needed to relieve symptoms of pain and swelling. 3.  For pain relief, take acetominophen 650 mg 4-6 hours as needed or ibuprofen 400 mg every 6-8 hours as needed or naproxen 250-500 mg every 12 hours as needed. 4.  You can also use hydrocortisone cream 0.5% or 1% up to 4 times daily as  needed for itching. 5.  If the sting becomes very itchy, take Benadryl 25-50 mg, follow directions on box. 6.  Wash the area 2-3 times daily with antibacterial soap and warm water. 7. Call your Doctor if: Fever, a severe headache, or rash occur in the next 2 weeks. Sting area begins to look infected. Redness and swelling worsens after home treatment. Your current symptoms become worse.    MAKE SURE YOU:  Understand these instructions. Will watch your condition. Will get help right away if you are not doing well or get worse.  Thank you for choosing an e-visit.  Your e-visit answers were reviewed by a board certified advanced clinical practitioner to complete your personal care plan. Depending upon the condition, your plan could have included both over the counter or prescription medications.  Please review your pharmacy choice. Make sure the pharmacy is open so you can pick up prescription now. If there is a problem, you may contact your provider through [employer] and have the prescription routed to another pharmacy.  Your safety is important to [place]. If you have drug allergies check your prescription carefully.   For the next 24 hours you can use MyChart to ask questions about today's visit, request a non-urgent call back, or ask for a work or school excuse. You will get an email in the next  two days asking about your experience. I hope that your e-visit has been valuable and will speed your recovery.

## 2024-04-01 NOTE — Progress Notes (Signed)
 I have spent 5 minutes in review of e-visit questionnaire, review and updating patient chart, medical decision making and response to patient.   Piedad Climes, PA-C

## 2024-04-03 ENCOUNTER — Ambulatory Visit: Payer: Self-pay | Admitting: Family Medicine

## 2024-04-03 ENCOUNTER — Encounter: Payer: Self-pay | Admitting: Family Medicine

## 2024-04-03 VITALS — BP 130/80 | HR 68 | Ht 63.5 in | Wt 222.5 lb

## 2024-04-03 DIAGNOSIS — E781 Pure hyperglyceridemia: Secondary | ICD-10-CM

## 2024-04-03 DIAGNOSIS — I10 Essential (primary) hypertension: Secondary | ICD-10-CM

## 2024-04-03 DIAGNOSIS — Z0000 Encounter for general adult medical examination without abnormal findings: Secondary | ICD-10-CM

## 2024-04-03 DIAGNOSIS — Z1211 Encounter for screening for malignant neoplasm of colon: Secondary | ICD-10-CM | POA: Diagnosis not present

## 2024-04-03 DIAGNOSIS — Z1231 Encounter for screening mammogram for malignant neoplasm of breast: Secondary | ICD-10-CM

## 2024-04-03 DIAGNOSIS — F411 Generalized anxiety disorder: Secondary | ICD-10-CM

## 2024-04-03 DIAGNOSIS — R7309 Other abnormal glucose: Secondary | ICD-10-CM

## 2024-04-03 DIAGNOSIS — D509 Iron deficiency anemia, unspecified: Secondary | ICD-10-CM

## 2024-04-03 DIAGNOSIS — R9230 Dense breasts, unspecified: Secondary | ICD-10-CM | POA: Diagnosis not present

## 2024-04-03 DIAGNOSIS — G4733 Obstructive sleep apnea (adult) (pediatric): Secondary | ICD-10-CM

## 2024-04-03 MED ORDER — AMLODIPINE BESYLATE 5 MG PO TABS
5.0000 mg | ORAL_TABLET | Freq: Every day | ORAL | 3 refills | Status: AC
Start: 2024-04-03 — End: ?

## 2024-04-03 MED ORDER — BUSPIRONE HCL 5 MG PO TABS: 5.0000 mg | ORAL_TABLET | Freq: Three times a day (TID) | ORAL | 3 refills | Status: AC

## 2024-04-03 MED ORDER — VALSARTAN 80 MG PO TABS: 80.0000 mg | ORAL_TABLET | Freq: Every day | ORAL | 3 refills | Status: AC

## 2024-04-03 NOTE — Progress Notes (Signed)
 Subjective:    Patient ID: Elizabeth Schultz, female    DOB: Jul 29, 1976, 48 y.o.   MRN: [id number]  Elizabeth Schultz is a 48 y.o. female presenting on 04/03/2024 for Annual Exam   HPI  Discussed the use of AI scribe software for clinical note transcription with the patient, who gave verbal consent to proceed.  History of Present Illness   Elizabeth Schultz is a 48 year old female who presents for an annual physical exam.    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 Since weight loss 40+ lbs and also using CPAP but no longer snoring or having breathing problems.   Anxiety Controlled On Escitalopram  10mg  daily Buspar  5mg  from 3 times per day down to 2 per day   Anemia History of anemia requiring infusion iron She has history bariatric surgery and this contributes to reduced absorption She is concerned about her iron levels, feeling 'super tired' and suspecting low iron. She had low iron levels four months ago and is worried about needing an infusion.  Hypertension Stable She had OSA and now has CPAP machine with improvement in BP and sleep  Current meds - Amlodipine  5mg  daily and Valsartan  80mg  daily Note she had swelling on Amlodipine  10mg  Reports good compliance, took meds today. Tolerating well, w/o complaints. Denies CP, dyspnea, HA, edema, dizziness / lightheadedness   The patient is currently taking a bariatric vitamin (ADEK) to address malnutrition issues, but notes that her body is not absorbing the iron in the supplement. She is also taking Valsartan  and Amlodipine  for her hypertension, and Lexapro  for her anxiety.    OSA, on CPAP - Patient reports prior history of dx OSA and dx OSA and on CPAP Improved with CPAP BP has improved overall    Anxiety On Escitalopram  10mg  daily On Buspar  5mg  twice a day regularly  Health Maintenance:   Due for Pap Smear - will return to [facility] G[facility] Dr Harles Lied   Due for Colon CA Screening age 64+, order Cologuard      04/03/2024    9:17 AM 10/04/2023    9:31 AM 04/04/2023    8:13 AM  Depression screen PHQ 2/9  Decreased Interest 0 0 0  Down, Depressed, Hopeless 0 0 1  PHQ - 2 Score 0 0 1  Altered sleeping 1 0 1  Tired, decreased energy 1 1 3   Change in appetite 0 0 3  Feeling bad or failure about yourself  0 0 3  Trouble concentrating 0 0 0  Moving slowly or fidgety/restless 0 0 0  Suicidal thoughts 0 0 0  PHQ-9 Score 2 1 11   Difficult doing work/chores Not difficult at all         04/03/2024    9:17 AM 10/04/2023    9:32 AM 04/04/2023    8:13 AM 01/01/2023    9:00 AM  GAD 7 : Generalized Anxiety Score  Nervous, Anxious, on Edge 1 0 2 3  Control/stop worrying 0 1 1 1   Worry too much - different things 1 1 1 2   Trouble relaxing 1 0 3 3  Restless 1 0 0 0  Easily annoyed or irritable 0 0 0 0  Afraid - awful might happen 0 0 0 0  Total GAD 7 Score 4 2 7  9  Anxiety Difficulty Not difficult at all   Not difficult at all     Past Medical History:  Diagnosis Date   Anxiety    Past Surgical History:  Procedure Laterality Date   ANTERIOR CRUCIATE LIGAMENT REPAIR  05/2011   BARIATRIC SURGERY     TUBAL LIGATION Bilateral 03/2003   WISDOM TOOTH EXTRACTION Bilateral 11/1995   Social History   Socioeconomic History   Marital status: Married    Spouse name: Not on file   Number of children: Not on file   Years of education: Not on file   Highest education level: Some college, no degree  Occupational History   Not on file  Tobacco Use   Smoking status: Never   Smokeless tobacco: Never  Substance and Sexual Activity   Alcohol use: Not Currently   Drug use: Never   Sexual activity: Yes  Other Topics Concern   Not on file  Social History Narrative   Not on file   Social  Drivers of Health   Financial Resource Strain: Low Risk  (10/01/2023)   Overall Financial Resource Strain (CARDIA)    Difficulty of Paying Living Expenses: Not hard at all  Food Insecurity: No Food Insecurity (10/01/2023)   Hunger Vital Sign    Worried About Running Out of Food in the Last Year: Never true    Ran Out of Food in the Last Year: Never true  Transportation Needs: No Transportation Needs (10/01/2023)   PRAPARE - Administrator, Civil Service (Medical): No    Lack of Transportation (Non-Medical): No  Physical Activity: Insufficiently Active (10/01/2023)   Exercise Vital Sign    Days of Exercise per Week: 4 days    Minutes of Exercise per Session: 30 min  Stress: No Stress Concern Present (10/01/2023)   [employer] of Occupational Health - Occupational Stress Questionnaire    Feeling of Stress : Not at all  Social Connections: Socially Integrated (10/01/2023)   Social Connection and Isolation Panel    Frequency of Communication with Friends and Family: Twice a week    Frequency of Social Gatherings with Friends and Family: Once a week    Attends Religious Services: More than 4 times per year    Active Member of [employer] or Organizations: Yes    Attends Engineer, structural: More than 4 times per year    Marital Status: Married  Catering manager Violence: Not on file   Family History  Problem Relation Age of Onset   Heart disease Mother    Thyroid disease Mother    Bipolar disorder Father    Breast cancer Maternal Grandmother 45   Bipolar disorder Paternal Grandmother    Breast cancer Maternal Aunt 52   Breast cancer Paternal Aunt 61   Current Outpatient Medications on File Prior to Visit  Medication Sig   albuterol  (VENTOLIN  HFA) 108 (90 Base) MCG/ACT inhaler Inhale 1-2 puffs into the lungs every 6 (six) hours as needed.   escitalopram  (LEXAPRO ) 10 MG tablet Take 1 tablet (10 mg total) by mouth daily.   omeprazole  (PRILOSEC) 20 MG capsule  TAKE 1 CAPSULE BY MOUTH DAILY BEFORE BREAKFAST.   No current facility-administered medications on file prior to visit.   Family History  Problem Relation Age of Onset   Heart disease Mother    Thyroid disease Mother    Bipolar disorder Father    Breast cancer Maternal Grandmother 7   Bipolar disorder Paternal Grandmother    Breast  cancer Maternal Aunt 52   Breast cancer Paternal Aunt 64     Review of Systems  Constitutional:  Negative for activity change, appetite change, chills, diaphoresis, fatigue and fever.  HENT:  Negative for congestion and hearing loss.   Eyes:  Negative for visual disturbance.  Respiratory:  Negative for cough, chest tightness, shortness of breath and wheezing.   Cardiovascular:  Negative for chest pain, palpitations and leg swelling.  Gastrointestinal:  Negative for abdominal pain, constipation, diarrhea, nausea and vomiting.  Genitourinary:  Negative for dysuria, frequency and hematuria.  Musculoskeletal:  Negative for arthralgias and neck pain.  Skin:  Negative for rash.  Neurological:  Negative for dizziness, weakness, light-headedness, numbness and headaches.  Hematological:  Negative for adenopathy.  Psychiatric/Behavioral:  Negative for behavioral problems, dysphoric mood and sleep disturbance.    Per HPI unless specifically indicated above     Objective:    BP 130/80 (BP Location: Left Arm, Patient Position: Sitting, Cuff Size: Normal)   Pulse 68   Ht 5' 3.5 (1.613 m)   Wt 222 lb 8 oz (100.9 kg)   SpO2 98%   BMI 38.80 kg/m   Wt Readings from Last 3 Encounters:  04/03/24 222 lb 8 oz (100.9 kg)  10/04/23 245 lb (111.1 kg)  06/19/23 280 lb (127 kg)    Physical Exam Vitals and nursing note reviewed.  Constitutional:      General: She is not in acute distress.    Appearance: She is well-developed. She is not diaphoretic.     Comments: Well-appearing, comfortable, cooperative  HENT:     Head: Normocephalic and atraumatic.   Eyes:      General:        Right eye: No discharge.        Left eye: No discharge.     Conjunctiva/sclera: Conjunctivae normal.     Pupils: Pupils are equal, round, and reactive to light.   Neck:     Thyroid: No thyromegaly.     Vascular: No carotid bruit.   Cardiovascular:     Rate and Rhythm: Normal rate and regular rhythm.     Pulses: Normal pulses.     Heart sounds: Normal heart sounds. No murmur heard. Pulmonary:     Effort: Pulmonary effort is normal. No respiratory distress.     Breath sounds: Normal breath sounds. No wheezing or rales.  Abdominal:     General: Bowel sounds are normal. There is no distension.     Palpations: Abdomen is soft. There is no mass.     Tenderness: There is no abdominal tenderness.   Musculoskeletal:        General: No tenderness. Normal range of motion.     Cervical back: Normal range of motion and neck supple.     Right lower leg: No edema.     Left lower leg: No edema.     Comments: Upper / Lower Extremities: - Normal muscle tone, strength bilateral upper extremities 5/5, lower extremities 5/5  Lymphadenopathy:     Cervical: No cervical adenopathy.   Skin:    General: Skin is warm and dry.     Findings: No erythema or rash.   Neurological:     Mental Status: She is alert and oriented to person, place, and time.     Comments: Distal sensation intact to light touch all extremities  Psychiatric:        Mood and Affect: Mood normal.        Behavior: Behavior normal.  Thought Content: Thought content normal.     Comments: Well groomed, good eye contact, normal speech and thoughts     Results for orders placed or performed in visit on 11/26/23  Prealbumin   Collection Time: 11/26/23  8:12 AM  Result Value Ref Range   Prealbumin 15 (L) 17 - 34 mg/dL  Folate   Collection Time: 11/26/23  8:12 AM  Result Value Ref Range   Folate 4.7 (L) ng/mL  Copper , serum   Collection Time: 11/26/23  8:12 AM  Result Value Ref Range   Copper  112 70 -  175 mcg/dL  Zinc    Collection Time: 11/26/23  8:12 AM  Result Value Ref Range   Zinc  94 60 - 130 mcg/dL  Reticulocytes   Collection Time: 11/26/23  8:12 AM  Result Value Ref Range   Retic Ct Pct 1.0 %   ABS Retic 47,400 20,000 - 80,000 cells/uL  Vitamin A    Collection Time: 11/26/23  8:12 AM  Result Value Ref Range   Vitamin A  (Retinoic Acid) 32 (L) 38 - 98 mcg/dL  VITAMIN D  25 Hydroxy (Vit-D Deficiency, Fractures)   Collection Time: 11/26/23  8:12 AM  Result Value Ref Range   Vit D, 25-Hydroxy 47 30 - 100 ng/mL  Iron, TIBC and Ferritin Panel   Collection Time: 11/26/23  8:12 AM  Result Value Ref Range   Iron 42 40 - 190 mcg/dL   TIBC 454 098 - 119 mcg/dL (calc)   %SAT 14 (L) 16 - 45 % (calc)   Ferritin 53 16 - 232 ng/mL  CBC with Differential/Platelet   Collection Time: 11/26/23  8:12 AM  Result Value Ref Range   WBC 8.3 3.8 - 10.8 Thousand/uL   RBC 4.74 3.80 - 5.10 Million/uL   Hemoglobin 12.8 11.7 - 15.5 g/dL   HCT 14.7 82.9 - 56.2 %   MCV 84.6 80.0 - 100.0 fL   MCH 27.0 27.0 - 33.0 pg   MCHC 31.9 (L) 32.0 - 36.0 g/dL   RDW 13.0 86.5 - 78.4 %   Platelets 348 140 - 400 Thousand/uL   MPV 10.7 7.5 - 12.5 fL   Neutro Abs 5,196 1,500 - 7,800 cells/uL   Absolute Lymphocytes 2,224 850 - 3,900 cells/uL   Absolute Monocytes 598 200 - 950 cells/uL   Eosinophils Absolute 232 15 - 500 cells/uL   Basophils Absolute 50 0 - 200 cells/uL   Neutrophils Relative % 62.6 %   Total Lymphocyte 26.8 %   Monocytes Relative 7.2 %   Eosinophils Relative 2.8 %   Basophils Relative 0.6 %  COMPLETE METABOLIC PANEL WITH GFR   Collection Time: 11/26/23  8:12 AM  Result Value Ref Range   Glucose, Bld 88 65 - 99 mg/dL   BUN 11 7 - 25 mg/dL   Creat 6.96 2.95 - 2.84 mg/dL   eGFR 132 > OR = 60 GM/WNU/2.72Z3   BUN/Creatinine Ratio SEE NOTE: 6 - 22 (calc)   Sodium 140 135 - 146 mmol/L   Potassium 3.9 3.5 - 5.3 mmol/L   Chloride 104 98 - 110 mmol/L   CO2 28 20 - 32 mmol/L   Calcium 9.1 8.6 -  10.2 mg/dL   Total Protein 6.7 6.1 - 8.1 g/dL   Albumin 4.0 3.6 - 5.1 g/dL   Globulin 2.7 1.9 - 3.7 g/dL (calc)   AG Ratio 1.5 1.0 - 2.5 (calc)   Total Bilirubin 0.5 0.2 - 1.2 mg/dL   Alkaline phosphatase (APISO) 105 31 -  125 U/L   AST 13 10 - 35 U/L   ALT 18 6 - 29 U/L  Vitamin B1   Collection Time: 11/26/23  8:12 AM  Result Value Ref Range   Vitamin B1 (Thiamine) 10 8 - 30 nmol/L  Vitamin B12   Collection Time: 11/26/23  8:12 AM  Result Value Ref Range   Vitamin B-12 301 200 - 1,100 pg/mL      Assessment & Plan:   Problem List Items Addressed This Visit     Essential hypertension   Relevant Medications   valsartan  (DIOVAN ) 80 MG tablet   amLODipine  (NORVASC ) 5 MG tablet   Other Relevant Orders   Lipid panel   CBC with Differential/Platelet   Comprehensive metabolic panel with GFR   GAD (generalized anxiety disorder)   Relevant Medications   busPIRone  (BUSPAR ) 5 MG tablet   Morbid obesity with BMI of 40.0-44.9, adult (HCC)   OSA on CPAP   Other Visit Diagnoses       Annual physical exam    -  Primary   Relevant Orders   TSH   Lipid panel   Hemoglobin A1c   CBC with Differential/Platelet   Comprehensive metabolic panel with GFR     Screening for colon cancer       Relevant Orders   Cologuard     Dense breast tissue       Relevant Orders   MR BREAST BILATERAL W WO CONTRAST INC CAD     Encounter for screening mammogram for malignant neoplasm of breast       Relevant Orders   MR BREAST BILATERAL W WO CONTRAST INC CAD     Iron deficiency anemia, unspecified iron deficiency anemia type       Relevant Orders   Iron, TIBC and Ferritin Panel   CBC with Differential/Platelet     Abnormal glucose       Relevant Orders   Hemoglobin A1c     Hypertriglyceridemia       Relevant Medications   valsartan  (DIOVAN ) 80 MG tablet   amLODipine  (NORVASC ) 5 MG tablet   Other Relevant Orders   TSH   Lipid panel        Updated Health Maintenance information Labs  ordered Encouraged improvement to lifestyle with diet and exercise Goal of weight loss  S/p Bariatric Surgery Significant weight loss. Reduced absorption.   Iron Deficiency Anemia Previous labs showed low iron saturation. Iron panel needed to assess necessity of iron infusion. - Order iron panel including hemoglobin, iron saturation, and ferritin. - Discuss results with hematologist on June 30th. - Anticipate upcoming Endoscopy EGD per Heme recs  Insect Bite L lower leg E visit 6/17 done Significant reaction with swelling and itching. Prednisone reduced swelling. Topical cortisone recommended post-prednisone. - Continue prednisone as prescribed. - Apply topical cortisone to affected area.  OSA on CPAP Continues therapy with CPAP Benefits and tolerating well  Colorectal Cancer Screening Eligible for screening at age 69. Family history of colon cancer. Cologuard discussed as non-invasive option. Colonoscopy required if Cologuard is positive. - Order Cologuard test.  Breast Cancer Screening Dense breast tissue limits mammogram effectiveness. MRI recommended for better screening. - Order breast MRI for screening. - Coordinate with insurance for MRI approval.  Cervical Cancer Screening Overdue for Pap smear. Encouraged to schedule with Dr. Harles Lied. - Schedule Pap smear with Dr. Harles Lied.  Hypertension Blood pressure controlled at 130/80 mmHg with amlodipine  and valsartan . Valsartan  has zero refills, amlodipine  running low. -  Refill valsartan  prescription. - Refill amlodipine  prescription.  Anxiety Anxiety well-managed with Buspar . Prescription running low. - Refill Buspar  prescription.  General Health Maintenance Reviewed vaccinations and screenings. COVID booster and pneumonia vaccine not required. - Postpone pneumonia vaccine until age 23 or if high risk factors develop.  Follow-up Prefers annual follow-up unless new issues arise. - Schedule annual follow-up  appointment. - Contact if new issues or changes occur.      Route to [place] that she needs MRI for breast cancer screening due to prior mammogram with dense breast tissue.   Orders Placed This Encounter  Procedures   MR BREAST BILATERAL W WO CONTRAST INC CAD    Standing Status:   Future    Expiration Date:   04/03/2025    If indicated for the ordered procedure, I authorize the administration of contrast media per Radiology protocol:   Yes    What is the patient's sedation requirement?:   No Sedation    Does the patient have a pacemaker or implanted devices?:   No    Preferred imaging location?:   [facility] (table limit - 550lbs)   Cologuard   Iron, TIBC and Ferritin Panel   TSH   Lipid panel    Has the patient fasted?:   Yes   Hemoglobin A1c   CBC with Differential/Platelet   Comprehensive metabolic panel with GFR    Has the patient fasted?:   Yes    Meds ordered this encounter  Medications   valsartan  (DIOVAN ) 80 MG tablet    Sig: Take 1 tablet (80 mg total) by mouth daily.    Dispense:  90 tablet    Refill:  3   busPIRone  (BUSPAR ) 5 MG tablet    Sig: Take 1 tablet (5 mg total) by mouth 3 (three) times daily.    Dispense:  270 tablet    Refill:  3   amLODipine  (NORVASC ) 5 MG tablet    Sig: Take 1 tablet (5 mg total) by mouth daily.    Dispense:  90 tablet    Refill:  3     Follow up plan: Return for 1 year fasting lab > 1 week later Annual Physical.  Future lab 04/03/25  Domingo Friend, DO [facility] 04/03/2024, 9:40 AM

## 2024-04-03 NOTE — Patient Instructions (Addendum)
 Thank you for coming to the office today.  Call and schedule w/ [facility] Dr Harles Lied for routine pap smear update.  Refills  Labs including Iron panel to check if need infusion.  MRI for breast cancer screening ordered at [facility]   If there is an abnormality with Cologuard, then we will recommend Colonoscopy. Try to get Cologuard done ASAP  Colon Cancer Screening: Ordered the Cologuard (home kit) test for colon cancer screening. Stay tuned for further updates.  It will be shipped to you directly. If not received in 2-4 weeks, call us  or the company.   If you send it back and no results are received in 2-4 weeks, call us  or the company as well!   Colon Cancer Screening: - For all adults age 28+ routine colon cancer screening is highly recommended.     - Recent guidelines from American Cancer Society recommend starting age of 65 - Early detection of colon cancer is important, because often there are no warning signs or symptoms, also if found early usually it can be cured. Late stage is hard to treat.   - If Cologuard is NEGATIVE, then it is good for 3 years before next due - If Cologuard is POSITIVE, then it is strongly advised to get a Colonoscopy, which allows the GI doctor to locate the source of the cancer or polyp (even very early stage) and treat it by removing it. ------------------------- Follow instructions to collect sample, you may call the company for any help or questions, 24/7 telephone support at [phone].     Please schedule a Follow-up Appointment to: Return for 1 year fasting lab > 1 week later Annual Physical.  If you have any other questions or concerns, please feel free to call the office or send a message through MyChart. You may also schedule an earlier appointment if necessary.  Additionally, you may be receiving a survey about your experience at our office within a few days to 1 week by e-mail or mail. We value your feedback.  Domingo Friend,  DO [facility], [place]

## 2024-04-04 LAB — COMPREHENSIVE METABOLIC PANEL WITH GFR
AG Ratio: 1.8 (calc) (ref 1.0–2.5)
ALT: 17 U/L (ref 6–29)
AST: 12 U/L (ref 10–35)
Albumin: 3.9 g/dL (ref 3.6–5.1)
Alkaline phosphatase (APISO): 84 U/L (ref 31–125)
BUN: 9 mg/dL (ref 7–25)
CO2: 28 mmol/L (ref 20–32)
Calcium: 8.8 mg/dL (ref 8.6–10.2)
Chloride: 104 mmol/L (ref 98–110)
Creat: 0.77 mg/dL (ref 0.50–0.99)
Globulin: 2.2 g/dL (ref 1.9–3.7)
Glucose, Bld: 85 mg/dL (ref 65–99)
Potassium: 3.7 mmol/L (ref 3.5–5.3)
Sodium: 141 mmol/L (ref 135–146)
Total Bilirubin: 0.4 mg/dL (ref 0.2–1.2)
Total Protein: 6.1 g/dL (ref 6.1–8.1)
eGFR: 97 mL/min/{1.73_m2} (ref >=60)

## 2024-04-04 LAB — CBC WITH DIFFERENTIAL/PLATELET
Absolute Lymphocytes: 2994 {cells}/uL (ref 850–3900)
Absolute Monocytes: 774 {cells}/uL (ref 200–950)
Basophils Absolute: 36 {cells}/uL (ref 0–200)
Basophils Relative: 0.4 %
Eosinophils Absolute: 364 {cells}/uL (ref 15–500)
Eosinophils Relative: 4 %
HCT: 39.1 % (ref 35.0–45.0)
Hemoglobin: 12.5 g/dL (ref 11.7–15.5)
MCH: 28.2 pg (ref 27.0–33.0)
MCHC: 32 g/dL (ref 32.0–36.0)
MCV: 88.3 fL (ref 80.0–100.0)
MPV: 9.6 fL (ref 7.5–12.5)
Monocytes Relative: 8.5 %
Neutro Abs: 4932 {cells}/uL (ref 1500–7800)
Neutrophils Relative %: 54.2 %
Platelets: 399 10*3/uL (ref 140–400)
RBC: 4.43 10*6/uL (ref 3.80–5.10)
RDW: 13.1 % (ref 11.0–15.0)
Total Lymphocyte: 32.9 %
WBC: 9.1 10*3/uL (ref 3.8–10.8)

## 2024-04-04 LAB — LIPID PANEL
Cholesterol: 145 mg/dL (ref <200)
HDL: 36 mg/dL — ABNORMAL LOW (ref >=50)
LDL Cholesterol (Calc): 82 mg/dL
Non-HDL Cholesterol (Calc): 109 mg/dL (ref <130)
Total CHOL/HDL Ratio: 4 (calc) (ref <5.0)
Triglycerides: 172 mg/dL — ABNORMAL HIGH (ref <150)

## 2024-04-04 LAB — HEMOGLOBIN A1C
Hgb A1c MFr Bld: 5.2 % (ref <5.7)
Mean Plasma Glucose: 103 mg/dL
eAG (mmol/L): 5.7 mmol/L

## 2024-04-04 LAB — IRON,TIBC AND FERRITIN PANEL
%SAT: 11 % — ABNORMAL LOW (ref 16–45)
Ferritin: 34 ng/mL (ref 16–232)
Iron: 37 ug/dL — ABNORMAL LOW (ref 40–190)
TIBC: 340 ug/dL (ref 250–450)

## 2024-04-04 LAB — TSH: TSH: 3.06 m[IU]/L

## 2024-04-07 ENCOUNTER — Ambulatory Visit
Admission: RE | Admit: 2024-04-07 | Discharge: 2024-04-07 | Disposition: A | Discharge status: Home / Self Care | Source: Ambulatory Visit | Attending: Family Medicine | Admitting: Family Medicine

## 2024-04-07 DIAGNOSIS — R9230 Dense breasts, unspecified: Secondary | ICD-10-CM | POA: Diagnosis present

## 2024-04-07 DIAGNOSIS — Z1231 Encounter for screening mammogram for malignant neoplasm of breast: Secondary | ICD-10-CM | POA: Diagnosis present

## 2024-04-07 MED ORDER — GADOBUTROL 1 MMOL/ML IV SOLN
10.0000 mL | Freq: Once | INTRAVENOUS | Status: AC | PRN
  Administered 2024-04-07: 10 mL via INTRAVENOUS

## 2024-04-10 ENCOUNTER — Ambulatory Visit: Payer: Self-pay | Admitting: Family Medicine

## 2024-07-08 ENCOUNTER — Other Ambulatory Visit: Payer: Self-pay | Admitting: Family Medicine

## 2024-07-08 DIAGNOSIS — F3341 Major depressive disorder, recurrent, in partial remission: Secondary | ICD-10-CM

## 2024-07-08 DIAGNOSIS — F411 Generalized anxiety disorder: Secondary | ICD-10-CM

## 2024-07-10 NOTE — Telephone Encounter (Signed)
 Too soon for refill, LRF 10/04/23 for 90 and 3RF.  Requested Prescriptions  Pending Prescriptions Disp Refills   escitalopram  (LEXAPRO ) 10 MG tablet [Pharmacy Med Name: ESCITALOPRAM  10 MG TABLET] 90 tablet 3    Sig: TAKE 1 TABLET BY MOUTH EVERY DAY     Psychiatry:  Antidepressants - SSRI Passed - 07/10/2024 11:21 AM      Passed - Completed PHQ-2 or PHQ-9 in the last 360 days      Passed - Valid encounter within last 6 months    Recent Outpatient Visits           3 months ago Annual physical exam   [facility] [place], Marsa PARAS, DO

## 2024-07-20 ENCOUNTER — Other Ambulatory Visit: Payer: Self-pay | Admitting: Family Medicine

## 2024-07-20 ENCOUNTER — Encounter: Payer: Self-pay | Admitting: Family Medicine

## 2024-07-20 DIAGNOSIS — F411 Generalized anxiety disorder: Secondary | ICD-10-CM

## 2024-07-20 DIAGNOSIS — F3341 Major depressive disorder, recurrent, in partial remission: Secondary | ICD-10-CM

## 2024-07-20 DIAGNOSIS — K219 Gastro-esophageal reflux disease without esophagitis: Secondary | ICD-10-CM

## 2024-07-21 MED ORDER — ESCITALOPRAM OXALATE 10 MG PO TABS: 10.0000 mg | ORAL_TABLET | Freq: Every day | ORAL | 0 refills | Status: DC

## 2024-07-22 NOTE — Telephone Encounter (Signed)
 Requested Prescriptions  Pending Prescriptions Disp Refills   omeprazole  (PRILOSEC) 20 MG capsule [Pharmacy Med Name: OMEPRAZOLE  DR 20 MG CAPSULE] 90 capsule 0    Sig: TAKE 1 CAPSULE BY MOUTH DAILY BEFORE BREAKFAST.     Gastroenterology: Proton Pump Inhibitors Passed - 07/22/2024 11:46 AM      Passed - Valid encounter within last 12 months    Recent Outpatient Visits           3 months ago Annual physical exam   [facility] [place], Marsa PARAS, [place]

## 2024-08-29 ENCOUNTER — Encounter: Payer: Self-pay | Admitting: Family Medicine

## 2024-08-29 DIAGNOSIS — Z1211 Encounter for screening for malignant neoplasm of colon: Secondary | ICD-10-CM

## 2024-09-16 ENCOUNTER — Telehealth: Admitting: Family Medicine

## 2024-09-16 DIAGNOSIS — K529 Noninfective gastroenteritis and colitis, unspecified: Secondary | ICD-10-CM

## 2024-09-16 DIAGNOSIS — K909 Intestinal malabsorption, unspecified: Secondary | ICD-10-CM | POA: Diagnosis not present

## 2024-09-16 DIAGNOSIS — Z9049 Acquired absence of other specified parts of digestive tract: Secondary | ICD-10-CM

## 2024-09-16 DIAGNOSIS — R152 Fecal urgency: Secondary | ICD-10-CM | POA: Diagnosis not present

## 2024-09-16 DIAGNOSIS — Z9884 Bariatric surgery status: Secondary | ICD-10-CM

## 2024-09-16 MED ORDER — DICYCLOMINE HCL 10 MG PO CAPS: 10.0000 mg | ORAL_CAPSULE | Freq: Three times a day (TID) | ORAL | 2 refills | Status: AC

## 2024-09-16 NOTE — Progress Notes (Signed)
 Subjective:    Patient ID: Elizabeth Schultz, female    DOB: [birth year], 48 y.o.   MRN: [id number]  Elizabeth Schultz is a 48 y.o. female presenting on 09/16/2024 for Diarrhea   Virtual / Telehealth Encounter - Video Visit via MyChart The purpose of this virtual visit is to provide medical care while limiting exposure to the novel coronavirus (COVID19) for both patient and office staff.  Consent was obtained for remote visit:  Yes.   Answered questions that patient had about telehealth interaction:  Yes.   I discussed the limitations, risks, security and privacy concerns of performing an evaluation and management service by video/telephone. I also discussed with the patient that there may be a patient responsible charge related to this service. The patient expressed understanding and agreed to proceed.  Patient Location: Work Provider Location: [employer] (Office)  Participants in virtual visit: - Patient: Elizabeth Schultz - CMA: Alan Fontana CMA - Provider: Dr Edman   HPI  Discussed the use of AI scribe software for clinical note transcription with the patient, who gave verbal consent to proceed.  History of Present Illness   [name] is a 48 year old female with a history of gallbladder removal and SADI duodenal switch bariatric surgery who presents with chronic diarrhea and urgency.  Chronic diarrhea and bowel urgency - Six to seven bowel movements per day, often with significant urgency making it difficult to reach the bathroom in time - Symptoms have persisted since gallbladder removal and small intestine thrombosis removal - Occasional need to bring extra clothes to work for changes due to urgency - Imodium AD reduces frequency to four or five bowel movements per day, but urgency remains prominent - No associated cramping or discomfort with urgency  Dietary modifications and response - Working with nutritionists from bariatric team and [facility]  to adjust diet - Current dietary plan focuses on consuming protein first, followed by complex carbohydrates and fats - Adhering to dietary plan for one month without improvement in diarrhea or urgency  Pharmacologic interventions for gastrointestinal symptoms - Imodium AD provides partial reduction in bowel movement frequency but does not alleviate urgency - Peppermint oil supplements (Ibogard) taken twice daily without significant relief - Dicyclomine prescribed around time of surgery for antispasmodic purposes; unclear if taken regularly  Post-surgical gastrointestinal history - 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 - Gallbladder removal and small intestine thrombosis removal prior to onset of chronic diarrhea  Hiatal hernia and indigestion - History of hiatal hernia with recurrence confirmed on upper GI test a few weeks ago - Chronic indigestion persists despite daily omeprazole  80 mg      Referral to [facility] has been placed recently, unable to do Cologuard collection and requesting Colonoscopy and consultation   Past Surgical History:  Procedure Laterality Date   ANTERIOR CRUCIATE LIGAMENT REPAIR  05/2011   BARIATRIC SURGERY  08/20/2023   SADI   LAPAROSCOPIC CHOLECYSTECTOMY  03/17/2022   TUBAL LIGATION Bilateral 03/2003   WISDOM TOOTH EXTRACTION Bilateral 11/1995       04/03/2024    9:17 AM 10/04/2023    9:31 AM 04/04/2023    8:13 AM  Depression screen PHQ 2/9  Decreased Interest 0 0 0  Down, Depressed, Hopeless 0 0 1  PHQ - 2 Score 0 0 1  Altered sleeping 1 0 1  Tired, decreased energy 1 1 3   Change in appetite 0 0 3  Feeling bad or failure about  yourself  0 0 3  Trouble concentrating 0 0 0  Moving slowly or fidgety/restless 0 0 0  Suicidal thoughts 0 0 0  PHQ-9 Score 2  1  11    Difficult doing work/chores Not difficult at all       Data saved with a previous flowsheet row definition       04/03/2024    9:17 AM 10/04/2023    9:32 AM  04/04/2023    8:13 AM 01/01/2023    9:00 AM  GAD 7 : Generalized Anxiety Score  Nervous, Anxious, on Edge 1 0 2 3  Control/stop worrying 0 1 1 1   Worry too much - different things 1 1 1 2   Trouble relaxing 1 0 3 3  Restless 1 0 0 0  Easily annoyed or irritable 0 0 0 0  Afraid - awful might happen 0 0 0 0  Total GAD 7 Score 4 2 7 9   Anxiety Difficulty Not difficult at all   Not difficult at all    Social History   Tobacco Use   Smoking status: Never   Smokeless tobacco: Never  Substance Use Topics   Alcohol use: Not Currently   Drug use: Never    Review of Systems Per HPI unless specifically indicated above     Objective:    There were no vitals taken for this visit.  Wt Readings from Last 3 Encounters:  04/03/24 222 lb 8 oz (100.9 kg)  10/04/23 245 lb (111.1 kg)  06/19/23 280 lb (127 kg)     Physical Exam  Note examination was completely remotely via video observation objective data only  Gen - well-appearing, no acute distress or apparent pain, comfortable HEENT - eyes appear clear without discharge or redness Heart/Lungs - cannot examine virtually - observed no evidence of coughing or labored breathing. Abd - cannot examine virtually  Skin - face visible today- no rash Neuro - awake, alert, oriented Psych - not anxious appearing   Results for orders placed or performed in visit on 04/03/24  Iron, TIBC and Ferritin Panel   Collection Time: 04/03/24 10:02 AM  Result Value Ref Range   Iron 37 (L) 40 - 190 mcg/dL   TIBC 659 749 - 549 mcg/dL (calc)   %SAT 11 (L) 16 - 45 % (calc)   Ferritin 34 16 - 232 ng/mL  TSH   Collection Time: 04/03/24 10:02 AM  Result Value Ref Range   TSH 3.06 mIU/L  Lipid panel   Collection Time: 04/03/24 10:02 AM  Result Value Ref Range   Cholesterol 145 <200 mg/dL   HDL 36 (L) > OR = 50 mg/dL   Triglycerides 827 (H) <150 mg/dL   LDL Cholesterol (Calc) 82 mg/dL (calc)   Total CHOL/HDL Ratio 4.0 <5.0 (calc)   Non-HDL  Cholesterol (Calc) 109 <130 mg/dL (calc)  Hemoglobin J8r   Collection Time: 04/03/24 10:02 AM  Result Value Ref Range   Hgb A1c MFr Bld 5.2 <5.7 %   Mean Plasma Glucose 103 mg/dL   eAG (mmol/L) 5.7 mmol/L  CBC with Differential/Platelet   Collection Time: 04/03/24 10:02 AM  Result Value Ref Range   WBC 9.1 3.8 - 10.8 Thousand/uL   RBC 4.43 3.80 - 5.10 Million/uL   Hemoglobin 12.5 11.7 - 15.5 g/dL   HCT 60.8 64.9 - 54.9 %   MCV 88.3 80.0 - 100.0 fL   MCH 28.2 27.0 - 33.0 pg   MCHC 32.0 32.0 - 36.0 g/dL   RDW  13.1 11.0 - 15.0 %   Platelets 399 140 - 400 Thousand/uL   MPV 9.6 7.5 - 12.5 fL   Neutro Abs 4,932 1,500 - 7,800 cells/uL   Absolute Lymphocytes 2,994 850 - 3,900 cells/uL   Absolute Monocytes 774 200 - 950 cells/uL   Eosinophils Absolute 364 15 - 500 cells/uL   Basophils Absolute 36 0 - 200 cells/uL   Neutrophils Relative % 54.2 %   Total Lymphocyte 32.9 %   Monocytes Relative 8.5 %   Eosinophils Relative 4.0 %   Basophils Relative 0.4 %  Comprehensive metabolic panel with GFR   Collection Time: 04/03/24 10:02 AM  Result Value Ref Range   Glucose, Bld 85 65 - 99 mg/dL   BUN 9 7 - 25 mg/dL   Creat 9.22 9.49 - 9.00 mg/dL   eGFR 97 > OR = 60 fO/fpw/8.26f7   BUN/Creatinine Ratio SEE NOTE: 6 - 22 (calc)   Sodium 141 135 - 146 mmol/L   Potassium 3.7 3.5 - 5.3 mmol/L   Chloride 104 98 - 110 mmol/L   CO2 28 20 - 32 mmol/L   Calcium 8.8 8.6 - 10.2 mg/dL   Total Protein 6.1 6.1 - 8.1 g/dL   Albumin 3.9 3.6 - 5.1 g/dL   Globulin 2.2 1.9 - 3.7 g/dL (calc)   AG Ratio 1.8 1.0 - 2.5 (calc)   Total Bilirubin 0.4 0.2 - 1.2 mg/dL   Alkaline phosphatase (APISO) 84 31 - 125 U/L   AST 12 10 - 35 U/L   ALT 17 6 - 29 U/L      Assessment & Plan:   Problem List Items Addressed This Visit   None Visit Diagnoses       Fecal urgency    -  Primary   Relevant Medications   dicyclomine (BENTYL) 10 MG capsule     S/P laparoscopic cholecystectomy         Chronic diarrhea        Relevant Medications   dicyclomine (BENTYL) 10 MG capsule     S/P bariatric surgery         Intestinal malabsorption, unspecified type       Relevant Medications   dicyclomine (BENTYL) 10 MG capsule       Chronic diarrhea with fecal urgency Chronic diarrhea with fecal urgency post-SADI duodenal switch (08/2023) and cholecystectomy.  Tried OTC Imodium AD reduces frequency but not urgency.  Ultimately diagnosis is likely due to faster transit and lack of gallbladder to help digest fats  She is continuing with Nutritionist through Bariatric center and [email or web address] and they are helping her meal plan and re-order her intake with avoiding fats until end of meal to help reduce the bowel urgency. Some relief but not resolving the problem.  Discussed dicyclomine and pancreatic enzymes. - Prescribed dicyclomine 10 mg TID before meals as a symptom relief treatment for anti spasmodic to reduce urgency - She has already tried and failed Peppermint Oil  Referral to [facility] has already been placed, they are waiting to schedule / in progress - She was unable to do Cologuard, she needs Colonoscopy and consultation - Advised follow-up with GI specialist Dr. Filbert for further evaluation and potential pancreatic enzyme therapy. - Instructed to contact office if no response from GI referral in one week.  Hiatal hernia with gastroesophageal reflux Hiatal hernia with GERD confirmed by upper GI test. Symptoms persist despite omeprazole . - Continue omeprazole  high dose     Route chart to [facility] to ensure  patient will get scheduled for GI Consult and not just Colonoscopy procedure only.  No orders of the defined types were placed in this encounter.   Meds ordered this encounter  Medications   dicyclomine (BENTYL) 10 MG capsule    Sig: Take 1 capsule (10 mg total) by mouth 3 (three) times daily before meals.    Dispense:  90 capsule    Refill:  2    Follow up plan: Return if symptoms worsen or fail  to improve.   Patient verbalizes understanding with the above medical recommendations including the limitation of remote medical advice.  Specific follow-up and call-back criteria were given for patient to follow-up or seek medical care more urgently if needed.  Total duration of direct patient care provided via video conference: 10 minutes   Marsa Officer, DO [facility] 09/16/2024, 11:17 AM

## 2024-09-16 NOTE — Patient Instructions (Addendum)
 Start the Dicyclomine  Bentyl  for bowel urgency and spasms. Take this before meals, regularly if it helps or can use it as needed if preferred.  This is not a resolution to the problem but it is mostly for symptom relief.  I recommend consultation next with Dr Filbert at [facility] to discuss further the diarrhea and your circumstances. I do think that this is a direct result of the surgeries you have experienced.  It is complicated and I do think the diet changes are a good move but ultimately this is a challenge.  Please message us  back in about 1-2 weeks if you have not heard back on the referral apt with [facility]. You would need both a Colonoscopy and a GI Consult apt.  Also can ask [facility] about the Pancreatic enzyme option to see if this will help reduce symptoms as well if you are candidate for that type of treatment. I don't offer it here but I have heard about it and maybe it is a possibility for you.  Please schedule a Follow-up Appointment to: Return if symptoms worsen or fail to improve.  If you have any other questions or concerns, please feel free to call the office or send a message through MyChart. You may also schedule an earlier appointment if necessary.  Additionally, you may be receiving a survey about your experience at our office within a few days to 1 week by e-mail or mail. We value your feedback.  Marsa Officer, DO [facility], [place]

## 2024-10-02 ENCOUNTER — Other Ambulatory Visit: Payer: Self-pay | Admitting: Family Medicine

## 2024-10-02 DIAGNOSIS — R152 Fecal urgency: Secondary | ICD-10-CM

## 2024-10-02 DIAGNOSIS — K529 Noninfective gastroenteritis and colitis, unspecified: Secondary | ICD-10-CM

## 2024-10-02 DIAGNOSIS — K909 Intestinal malabsorption, unspecified: Secondary | ICD-10-CM

## 2024-11-06 ENCOUNTER — Other Ambulatory Visit: Payer: Self-pay | Admitting: Family Medicine

## 2024-11-06 DIAGNOSIS — F411 Generalized anxiety disorder: Secondary | ICD-10-CM

## 2024-11-06 DIAGNOSIS — F3341 Major depressive disorder, recurrent, in partial remission: Secondary | ICD-10-CM

## 2024-11-06 NOTE — Telephone Encounter (Signed)
 Requested Prescriptions  Pending Prescriptions Disp Refills   escitalopram  (LEXAPRO ) 10 MG tablet [Pharmacy Med Name: ESCITALOPRAM  10 MG TABLET] 90 tablet 0    Sig: TAKE 1 TABLET BY MOUTH EVERY DAY     Psychiatry:  Antidepressants - SSRI Passed - 11/06/2024 11:31 AM      Passed - Completed PHQ-2 or PHQ-9 in the last 360 days      Passed - Valid encounter within last 6 months    Recent Outpatient Visits           1 month ago Fecal urgency   [place] [facility], Marsa PARAS, DO   7 months ago Annual physical exam   [facility] [place], Marsa PARAS, [place]

## 2025-04-03 ENCOUNTER — Other Ambulatory Visit

## 2025-04-10 ENCOUNTER — Encounter: Admitting: Family Medicine
